# Patient Record
Sex: Female | Born: 1965 | Race: Black or African American | Hispanic: No | Marital: Married | Smoking: Never smoker
Health system: Southern US, Community
[De-identification: ages and names within clinical notes are randomized; demographics above are authoritative.]

## PROBLEM LIST (undated history)

## (undated) DIAGNOSIS — I10 Essential (primary) hypertension: Secondary | ICD-10-CM

## (undated) DIAGNOSIS — J189 Pneumonia, unspecified organism: Secondary | ICD-10-CM

## (undated) DIAGNOSIS — K449 Diaphragmatic hernia without obstruction or gangrene: Secondary | ICD-10-CM

## (undated) DIAGNOSIS — M1990 Unspecified osteoarthritis, unspecified site: Secondary | ICD-10-CM

## (undated) DIAGNOSIS — E7800 Pure hypercholesterolemia, unspecified: Secondary | ICD-10-CM

## (undated) DIAGNOSIS — D649 Anemia, unspecified: Secondary | ICD-10-CM

## (undated) HISTORY — DX: Diaphragmatic hernia without obstruction or gangrene: K44.9

---

## 2000-07-03 ENCOUNTER — Other Ambulatory Visit: Admission: RE | Admit: 2000-07-03 | Discharge: 2000-07-03 | Payer: Self-pay | Admitting: Plastic Surgery

## 2001-12-31 ENCOUNTER — Other Ambulatory Visit: Admission: RE | Admit: 2001-12-31 | Discharge: 2001-12-31 | Payer: Self-pay | Admitting: Obstetrics and Gynecology

## 2002-11-29 ENCOUNTER — Other Ambulatory Visit: Admission: RE | Admit: 2002-11-29 | Discharge: 2002-11-29 | Payer: Self-pay | Admitting: Obstetrics and Gynecology

## 2003-02-14 ENCOUNTER — Ambulatory Visit (HOSPITAL_COMMUNITY): Admission: AD | Admit: 2003-02-14 | Discharge: 2003-02-14 | Payer: Self-pay | Admitting: Obstetrics and Gynecology

## 2003-04-27 ENCOUNTER — Encounter: Payer: Self-pay | Admitting: Obstetrics and Gynecology

## 2003-04-27 ENCOUNTER — Inpatient Hospital Stay (HOSPITAL_COMMUNITY): Admission: AD | Admit: 2003-04-27 | Discharge: 2003-04-29 | Payer: Self-pay | Admitting: Obstetrics and Gynecology

## 2007-03-06 ENCOUNTER — Ambulatory Visit (HOSPITAL_COMMUNITY): Admission: RE | Admit: 2007-03-06 | Discharge: 2007-03-06 | Payer: Self-pay | Admitting: Obstetrics and Gynecology

## 2008-06-02 ENCOUNTER — Ambulatory Visit (HOSPITAL_COMMUNITY): Admission: RE | Admit: 2008-06-02 | Discharge: 2008-06-02 | Payer: Self-pay | Admitting: Obstetrics and Gynecology

## 2011-04-05 NOTE — H&P (Signed)
   NAME:  Patricia Pineda, Patricia Pineda                           ACCOUNT NO.:  [id number]   MEDICAL RECORD NO.:  [id number]                   PATIENT TYPE:  INP   LOCATION:  LDR3                                 FACILITY:  [facility]   PHYSICIAN:  Tilda Burrow, M.D.              DATE OF BIRTH:  [birth year]   DATE OF ADMISSION:  04/27/2003  DATE OF DISCHARGE:                                HISTORY & PHYSICAL   REASON FOR ADMISSION:  Pregnancy at 33 weeks and 2 days with preeclampsia.   HISTORY OF PRESENT ILLNESS:  Patient was seen in the office Monday with a  slight elevation in her blood pressure.  DTRs were 1+ and she was sent home  on bed rest to be reevaluated today.  She is back in today with headache,  some dizziness, and the blood pressure 160/100 and 3+ protein in her urine.   MEDICAL HISTORY:  1. Positive for asthma.  2. Chronic bronchitis.  3. Positive RPR.  4. Positive trichomonas.   SURGICAL HISTORY:  1. Positive for breast reduction.  2. Cesarean section.   ALLERGIES:  She has no known allergies.   SOCIAL HISTORY:  She is married. Her husband lives here. He is present and  supportive.   FAMILY HISTORY:  Positive for diabetes and hypertension.   PRENATAL COURSE:  Complicated by a positive RPR which was treated, a  positive trichomonas which was treated, an elevated 1 hour and a normal 2  hour.  Blood type is 0 positive.  GBS is negative.  Rubella is 2.6.  Hepatitis B surface antigen is negative.  HIV is negative.  Pap was within  normal limits.  GC and Chlamydia were both negative.  Sickle cell screen was  negative.   PHYSICAL EXAMINATION:  VITAL SIGNS:  Physical exam, today, weight is 266  pounds.  Blood pressure is 160/100.  She has trace to 1+ edema.  HEART:  Heart is regular to rhythm and rate.  LUNGS:  Lungs are clear to auscultation bilaterally.  EXTREMITIES:  DTRs are trace to 1+.  No clonus.  ABDOMEN:  Fetal heart is 130, strong and regular.   She has 3+ protein in her  urine.    PLAN:  We are going to draw labs, admit her, give betamethasone 12 IM and  repeat in 24 hours.  Start a 24-hour urine.  Dr. Despina Hidden is the collaborating  physician and he collaborated with admission and plan of care.     Zerita Boers, Reita Cliche, M.D.    DL/MEDQ  D:  [id number]  T:  04/27/2003  Job:  [id number]   cc:   [facility]

## 2011-04-05 NOTE — Discharge Summary (Signed)
NAME:  Patricia Pineda, Patricia Pineda                           ACCOUNT NO.:  [id number]   MEDICAL RECORD NO.:  [id number]                   PATIENT TYPE:  INP   LOCATION:  LDR3                                 FACILITY:  [facility]   PHYSICIAN:  Tilda Burrow, M.D.              DATE OF BIRTH:  Nov 20, 1965   DATE OF ADMISSION:  04/27/2003  DATE OF DISCHARGE:  04/29/2003                                 DISCHARGE SUMMARY   ADMITTING DIAGNOSES:  1. Pregnancy at 33-2/7 days.  2. Pre-eclampsia.   DISCHARGE DIAGNOSES:  1. Pregnancy at 33-4/7 days, severe.  2. Pre-eclampsia (by proteinuria and blood pressure control criteria).  3. Continuing prior cesarean section, cervical unchangeability.   HISTORY OF PRESENT ILLNESS:  This 45 year old female gravida 2, para 1, AB  0, last menstrual period September 05, 2002, placing [facility] at June 13, 2003 with  ultrasound on October 26, 2002 agreeing within two days of menstrual EDC and  20 week ultrasound suggesting EDC of June 06, 2003, is admitted at 33-2/7  days after presenting to the office with elevated blood pressures.  She was  seen in our office and found to have blood pressure elevation of 160/100 two  days after prior visit to our office.  In addition to blood pressure  increase over the last two days, she had 3+ proteinuria in her urine.  She  is admitted for suspected pre-eclampsia and assessment stabilization, beta  methasone therapy.   PAST MEDICAL HISTORY:  1. Asthma.  2. Chronic bronchitis.  3. History of positive RPR.   SURGICAL HISTORY:  Positive for reduction mammoplasty and cesarean section.   ALLERGIES:  No known drug allergies.   SOCIAL HISTORY:  Married.  Husband lives here, present, supportive.   FAMILY HISTORY:  Positive for diabetes and hypertension.  The patient  personally has no history of elevated blood pressures.   PRENATAL COURSE:  Complicated by positive RPR which was treated through the  [facility].   Trichomonas treated.  Elevated one and  two hour glucose tolerance test.  Blood type is O positive, group B strep  negative, Rubella immunity negative at 2.6 greater than 10 considered  immune).  Hepatitis B surface antigen negative.  HIV negative.  Pap smear  within normal limits.  GC and Chlamydia negative.  Sickle index negative.   PHYSICAL EXAMINATION:  VITAL SIGNS:  Weight 266 pounds which was a 22 pound  weight gain this pregnancy with no recent dramatic change.  Blood pressure  160/100.  GENERAL:  She is a healthy appearing female.  HEART/LUNG:  Regular rhythm. Lungs clear.  ABDOMEN:  She has a 36 cm fundal height.  Fetal heart rate strong, 130 and  regular.  CERVICAL:  As done by me on April 29, 2003 showed the cervix to be firm,  long, closed, and posterior.   HOSPITAL COURSE:  The patient was admitted and  laboratory work followed up  from the office which showed normal liver function test.  The patient was  admitted, had 24 hour urine culture which returned finally showing an urine  protein excretion of greater than 4 grams per day.  Serum creatinine was  0.9.  The patient denied any right upper quadrant pain and only had mild  headache.  She was discussed with Dr. Shelly Flatten after compelling  results, including an ultrasound which showed an estimated fetal weight of  2500 gram infant.   DISPOSITION:  She was therefore transferred to [facility] for  continued observation.  Non stress test the day of discharge was reactive.                                               Tilda Burrow, M.D.    JVF/MEDQ  D:  04/29/2003  T:  04/29/2003  Job:  [id number]

## 2012-01-16 ENCOUNTER — Encounter: Payer: Self-pay | Admitting: Family

## 2012-01-16 ENCOUNTER — Ambulatory Visit (INDEPENDENT_AMBULATORY_CARE_PROVIDER_SITE_OTHER): Payer: 59 | Admitting: Family

## 2012-01-16 VITALS — BP 124/80 | Ht 64.25 in | Wt 241.0 lb

## 2012-01-16 DIAGNOSIS — J069 Acute upper respiratory infection, unspecified: Secondary | ICD-10-CM

## 2012-01-16 DIAGNOSIS — I10 Essential (primary) hypertension: Secondary | ICD-10-CM

## 2012-01-16 DIAGNOSIS — E785 Hyperlipidemia, unspecified: Secondary | ICD-10-CM

## 2012-01-16 LAB — BASIC METABOLIC PANEL
BUN: 11 mg/dL (ref 6–23)
CO2: 25 mEq/L (ref 19–32)
GFR: 104.23 mL/min (ref >60.00)
Glucose, Bld: 74 mg/dL (ref 70–99)
Potassium: 3.8 mEq/L (ref 3.5–5.1)

## 2012-01-16 LAB — CBC
Hemoglobin: 10.4 g/dL — ABNORMAL LOW (ref 12.0–15.0)
MCHC: 32.2 g/dL (ref 30.0–36.0)
MCV: 66.1 fl — ABNORMAL LOW (ref 78.0–100.0)
RDW: 18.5 % — ABNORMAL HIGH (ref 11.5–14.6)

## 2012-01-16 LAB — LIPID PANEL
Total CHOL/HDL Ratio: 4
Triglycerides: 122 mg/dL (ref 0.0–149.0)

## 2012-01-16 MED ORDER — PREDNISONE 20 MG PO TABS: ORAL_TABLET | ORAL | Status: AC

## 2012-01-16 MED ORDER — DEXTROMETHORPHAN HBR 15 MG/5ML PO SYRP: 10.0000 mL | ORAL_SOLUTION | Freq: Four times a day (QID) | ORAL | Status: AC | PRN

## 2012-01-16 NOTE — Progress Notes (Signed)
  Subjective:    Patient ID: Patricia Pineda, female    DOB: 04/29/1966, 46 y.o.   MRN: [id number]  HPI Comments: C/o chills, productive cough with yellow-tinged sputum, constant-hacking cough, and intermittent headaches described as pressure with no associated s/s, 6/10. Took OTC mucinex without any relief.   Cough Associated symptoms include chills, headaches, postnasal drip and a sore throat. Pertinent negatives include no ear pain, eye redness, fever, rhinorrhea, shortness of breath or wheezing.      Review of Systems  Constitutional: Positive for chills and fatigue. Negative for fever and appetite change.  HENT: Positive for congestion, sore throat, postnasal drip and sinus pressure. Negative for hearing loss, ear pain, rhinorrhea, sneezing and ear discharge.   Eyes: Negative for discharge and redness.  Respiratory: Positive for cough. Negative for apnea, chest tightness, shortness of breath, wheezing and stridor.   Cardiovascular: Negative.   Neurological: Positive for headaches.   No past medical history on file.  History   Social History  . Marital Status: Married    Spouse Name: N/A    Number of Children: N/A  . Years of Education: N/A   Occupational History  . Not on file.   Social History Main Topics  . Smoking status: Never Smoker   . Smokeless tobacco: Not on file  . Alcohol Use: Yes     occassionally  . Drug Use: No  . Sexually Active: Not on file   Other Topics Concern  . Not on file   Social History Narrative  . No narrative on file    No past surgical history on file.  No family history on file.  No Known Allergies  No current outpatient prescriptions on file prior to visit.    BP 124/80  Ht 5' 4.25" (1.632 m)  Wt 241 lb (109.317 kg)  BMI 41.05 kg/m2chart     Objective:   Physical Exam  Constitutional: She appears well-developed and well-nourished. No distress.  HENT:  Right Ear: External ear normal.  Left Ear: External ear normal.    Nose: Nose normal.  Mouth/Throat: Oropharynx is clear and moist. No oropharyngeal exudate.  Cardiovascular: Normal rate, regular rhythm, normal heart sounds and intact distal pulses.  Exam reveals no gallop and no friction rub.   No murmur heard. Pulmonary/Chest: Effort normal and breath sounds normal. No respiratory distress. She has no wheezes. She has no rales. She exhibits no tenderness.  Neurological: She is alert.  Skin: Skin is warm. She is not diaphoretic.          Assessment & Plan:  Assessment: URI, Cough  Plan: Prednisone. Labs: CBC, TSH, Lipids, BMP. RTC for physical exam. Rest and increase po fluid intake. RTC if s/s do not resolve in one week or get worse. Teaching handouts provided on treatments and diagnosis.

## 2012-01-16 NOTE — Patient Instructions (Signed)

## 2012-03-18 ENCOUNTER — Encounter: Payer: Self-pay | Admitting: Family

## 2012-03-18 ENCOUNTER — Ambulatory Visit (INDEPENDENT_AMBULATORY_CARE_PROVIDER_SITE_OTHER): Payer: 59 | Admitting: Family

## 2012-03-18 ENCOUNTER — Telehealth: Payer: Self-pay | Admitting: Family

## 2012-03-18 VITALS — BP 122/78 | HR 88 | Temp 98.9°F | Resp 12 | Ht 66.0 in | Wt 243.0 lb

## 2012-03-18 DIAGNOSIS — Z0000 Encounter for general adult medical examination without abnormal findings: Secondary | ICD-10-CM

## 2012-03-18 DIAGNOSIS — I10 Essential (primary) hypertension: Secondary | ICD-10-CM

## 2012-03-18 DIAGNOSIS — Z23 Encounter for immunization: Secondary | ICD-10-CM

## 2012-03-18 DIAGNOSIS — E7800 Pure hypercholesterolemia, unspecified: Secondary | ICD-10-CM

## 2012-03-18 MED ORDER — AMLODIPINE BESYLATE 5 MG PO TABS: 5.0000 mg | ORAL_TABLET | Freq: Every day | ORAL | Status: DC

## 2012-03-18 NOTE — Patient Instructions (Signed)
Hypercholesterolemia High Blood Cholesterol Cholesterol is a white, waxy, fat-like protein needed by your body in small amounts. The liver makes all the cholesterol you need. It is carried from the liver by the blood through the blood vessels. Deposits (plaque) may build up on blood vessel walls. This makes the arteries narrower and stiffer. Plaque increases the risk for heart attack and stroke. You cannot feel your cholesterol level even if it is very high. The only way to know is by a blood test to check your lipid (fats) levels. Once you know your cholesterol levels, you should keep a record of the test results. Work with your caregiver to to keep your levels in the desired range. WHAT THE RESULTS MEAN:  Total cholesterol is a rough measure of all the cholesterol in your blood.   LDL is the so-called bad cholesterol. This is the type that deposits cholesterol in the walls of the arteries. You want this level to be low.   HDL is the good cholesterol because it cleans the arteries and carries the LDL away. You want this level to be high.   Triglycerides are fat that the body can either burn for energy or store. High levels are closely linked to heart disease.  DESIRED LEVELS:  Total cholesterol below 200.   LDL below 100 for people at risk, below 70 for very high risk.   HDL above 50 is good, above 60 is best.   Triglycerides below 150.  HOW TO LOWER YOUR CHOLESTEROL:  Diet.   Choose fish or white meat chicken and [place], roasted or baked. Limit fatty cuts of red meat, fried foods, and processed meats, such as sausage and lunch meat.   Eat lots of fresh fruits and vegetables. Choose whole grains, beans, pasta, potatoes and cereals.   Use only small amounts of olive, corn or canola oils. Avoid butter, mayonnaise, shortening or palm kernel oils. Avoid foods with trans-fats.   Use skim/nonfat milk and low-fat/nonfat yogurt and cheeses. Avoid whole milk, cream, ice cream, egg yolks and  cheeses. Healthy desserts include angel food cake, gingersnaps, animal crackers, hard candy, popsicles, and low-fat/nonfat frozen yogurt. Avoid pastries, cakes, pies and cookies.   Exercise.   A regular program helps decrease LDL and raises HDL.   Helps with weight control.   Do things that increase your activity level like gardening, walking, or taking the stairs.   Medication.   May be prescribed by your caregiver to help lowering cholesterol and the risk for heart disease.   You may need medicine even if your levels are normal if you have several risk factors.  HOME CARE INSTRUCTIONS   Follow your diet and exercise programs as suggested by your caregiver.   Take medications as directed.   Have blood work done when your caregiver feels it is necessary.  MAKE SURE YOU:   Understand these instructions.   Will watch your condition.   Will get help right away if you are not doing well or get worse.  Document Released: 11/04/2005 Document Revised: 10/24/2011 Document Reviewed: 04/22/2007 [facility] Patient Information 2012 [place], [place].  Exercise to Lose Weight Exercise and a healthy diet may help you lose weight. Your doctor may suggest specific exercises. EXERCISE IDEAS AND TIPS  Choose low-cost things you enjoy doing, such as walking, bicycling, or exercising to workout videos.   Take stairs instead of the elevator.   Walk during your lunch break.   Park your car further away from work or school.  Go to a gym or an exercise class.   Start with 5 to 10 minutes of exercise each day. Build up to 30 minutes of exercise 4 to 6 days a week.   Wear shoes with good support and comfortable clothes.   Stretch before and after working out.   Work out until you breathe harder and your heart beats faster.   Drink extra water when you exercise.   Do not do so much that you hurt yourself, feel dizzy, or get very short of breath.  Exercises that burn about 150  calories:  Running 1  miles in 15 minutes.   Playing volleyball for 45 to 60 minutes.   Washing and waxing a car for 45 to 60 minutes.   Playing touch football for 45 minutes.   Walking 1  miles in 35 minutes.   Pushing a stroller 1  miles in 30 minutes.   Playing basketball for 30 minutes.   Raking leaves for 30 minutes.   Bicycling 5 miles in 30 minutes.   Walking 2 miles in 30 minutes.   Dancing for 30 minutes.   Shoveling snow for 15 minutes.   Swimming laps for 20 minutes.   Walking up stairs for 15 minutes.   Bicycling 4 miles in 15 minutes.   Gardening for 30 to 45 minutes.   Jumping rope for 15 minutes.   Washing windows or floors for 45 to 60 minutes.  Document Released: 12/07/2010 Document Revised: 10/24/2011 Document Reviewed: 12/07/2010 [facility] Patient Information 2012 [place], [place].

## 2012-03-18 NOTE — Telephone Encounter (Signed)
Done

## 2012-03-18 NOTE — Progress Notes (Signed)
  Subjective:    Patient ID: Patricia Pineda, female    DOB: 10/26/1966, 46 y.o.   MRN: [id number]  HPI  This is a routine physical examination for this healthy  Female. Reviewed all health maintenance protocols including mammography reviewed appropriate screening labs. Her immunization history was reviewed as well as her current medications and allergies refills of her chronic medications were given and the plan for yearly health maintenance was discussed all orders and referrals were made as appropriate.   Review of Systems  Constitutional: Negative.   HENT: Negative.   Eyes: Negative.   Respiratory: Negative.   Cardiovascular: Negative.   Gastrointestinal: Negative.   Genitourinary: Negative.   Musculoskeletal: Negative.   Skin: Negative.   Neurological: Negative.   Hematological: Negative.   Psychiatric/Behavioral: Negative.    No past medical history on file.  History   Social History  . Marital Status: Married    Spouse Name: N/A    Number of Children: N/A  . Years of Education: N/A   Occupational History  . Not on file.   Social History Main Topics  . Smoking status: Never Smoker   . Smokeless tobacco: Not on file  . Alcohol Use: Yes     occassionally  . Drug Use: No  . Sexually Active: Not on file   Other Topics Concern  . Not on file   Social History Narrative  . No narrative on file    No past surgical history on file.  No family history on file.  No Known Allergies  Current Outpatient Prescriptions on File Prior to Visit  Medication Sig Dispense Refill  . amLODipine (NORVASC) 5 MG tablet Take 5 mg by mouth daily.      . rosuvastatin (CRESTOR) 10 MG tablet Take 10 mg by mouth daily.        BP 122/78  Pulse 88  Temp 98.9 F (37.2 C)  Resp 12  Ht 5\' 6"  (1.676 m)  Wt 243 lb (110.224 kg)  BMI 39.22 kg/m2  SpO2 96%  LMP 04/25/2013chart    Objective:   Physical Exam  Constitutional: She is oriented to person, place, and time. She appears  well-developed and well-nourished.  HENT:  Head: Normocephalic.  Right Ear: External ear normal.  Left Ear: External ear normal.  Nose: Nose normal.  Mouth/Throat: Oropharynx is clear and moist.  Eyes: Conjunctivae and EOM are normal. Pupils are equal, round, and reactive to light.  Neck: Normal range of motion. Neck supple.  Cardiovascular: Normal rate, regular rhythm and normal heart sounds.   Pulmonary/Chest: Effort normal and breath sounds normal.  Abdominal: Soft. Bowel sounds are normal.  Genitourinary:       Deferred to GYN  Musculoskeletal: Normal range of motion.  Neurological: She is alert and oriented to person, place, and time. She has normal reflexes.  Skin: Skin is warm and dry.  Psychiatric: She has a normal mood and affect.    EKG not obtained, patient reports a normal EKG previous practice less than one year ago.      Assessment & Plan:  Assessment: Complete physical exam, hypertension, obesity, hyperlipidemia  Plan: Since her labs were drawn off of Crestor, she appears to be doing fine. Therefore, we will redraw her last 3 months just to be sure that she stooled well well off Crestor. Encouraged healthy diet and exercise, weight reduction, monthly self breast exams. Tdap administered. Continue current medications minus Crestor.

## 2012-03-18 NOTE — Telephone Encounter (Signed)
Patient was seen today and she forgot to ask for a refill of her norvasc. Please assist.

## 2012-05-25 DIAGNOSIS — E7800 Pure hypercholesterolemia, unspecified: Secondary | ICD-10-CM | POA: Insufficient documentation

## 2012-05-25 DIAGNOSIS — I10 Essential (primary) hypertension: Secondary | ICD-10-CM | POA: Insufficient documentation

## 2012-06-17 ENCOUNTER — Other Ambulatory Visit: Payer: 59

## 2012-07-31 ENCOUNTER — Other Ambulatory Visit (INDEPENDENT_AMBULATORY_CARE_PROVIDER_SITE_OTHER): Payer: 59

## 2012-07-31 DIAGNOSIS — E785 Hyperlipidemia, unspecified: Secondary | ICD-10-CM

## 2012-07-31 LAB — LIPID PANEL
Cholesterol: 216 mg/dL — ABNORMAL HIGH (ref 0–200)
HDL: 44.8 mg/dL (ref >39.00)
Triglycerides: 114 mg/dL (ref 0.0–149.0)
VLDL: 22.8 mg/dL (ref 0.0–40.0)

## 2012-10-21 ENCOUNTER — Telehealth: Payer: Self-pay | Admitting: Family

## 2012-10-21 NOTE — Telephone Encounter (Signed)
Patient Information:  Caller Name: Danica  Phone: [phone]  Patient: Patricia Pineda, Patricia Pineda  Gender: Female  DOB: 07-08-1966  Age: 46 Years  PCP: [facility])  Pregnant: No   Symptoms  Reason For Call & Symptoms: Onset was sudden with sore throat, fever- low grade; dry cough and head ache.  Reviewed Health History In EMR: Yes  Reviewed Medications In EMR: Yes  Reviewed Allergies In EMR: Yes  Reviewed Surgeries / Procedures: Yes  Date of Onset of Symptoms: 10/18/2012  Treatments Tried: Mucinex at night  Treatments Tried Worked: Yes OB:  LMP: 10/14/2012  Guideline(s) Used:  Colds  Disposition Per Guideline:   See Today or Tomorrow in Office  Reason For Disposition Reached:   Patient wants to be seen  Advice Given:  For a Stuffy Nose - Use Nasal Washes:  Introduction: Saline (salt water) nasal irrigation (nasal wash) is an effective and simple home remedy for treating stuffy nose and sinus congestion. The nose can be irrigated by pouring, spraying, or squirting salt water into the nose and then letting it run back out.  How it Helps: The salt water rinses out excess mucus, washes out any irritants (dust, allergens) that might be present, and moistens the nasal cavity.  Methods: There are several ways to perform nasal irrigation. You can use a saline nasal spray bottle (available over-the-counter), a rubber ear syringe, a medical syringe without the needle, or a Neti Pot.  Treatment for Associated Symptoms of Colds:  For muscle aches, headaches, or moderate fever (more than 101 F or 38.9 C): Take acetaminophen every 4 hours.  Cough: Use cough drops.  Hydrate: Drink adequate liquids.  Humidifier:  If the air in your home is dry, use a cool-mist humidifier  Contagiousness:  The cold virus is present in your nasal secretions.  Cover your nose and mouth with a tissue when you sneeze or cough.  Wash your hands frequently with soap and water.  Expected Course:    Nasal discharge 7-14 days  Cough up to 2-3 weeks.  Call Back If:  Difficulty breathing occurs  Fever lasts more than 3 days  Nasal discharge lasts more than 10 days  Cough lasts more than 3 weeks  You become worse  Cough Medicines:  Home Remedy - Honey: This old home remedy has been shown to help decrease coughing at night. The adult dosage is 2 teaspoons (10 ml) at bedtime. Honey should not be given to infants under one year of age.  Pain and Fever Medicines:  For pain or fever relief, take either acetaminophen or ibuprofen.  Office Follow Up:  Does the office need to follow up with this patient?: No  Instructions For The Office: N/A  Appointment Scheduled:  10/22/2012 09:15:00 Appointment Scheduled Provider:  Eleonore Chiquito (Family Practice)  RN Note:  Today 10/21/12-is short of breath, tight chest, with body aches.  Stuffiness head with cloudy drainage. Dry cough for the most part, but does cough up yellow sputum cough in mornings. Cough interrupts activities and rest. Intake is fair, but drinking well. Having Sinus pressure.  Appointment scheduled for 10/22/12 at 9:15 with Dr. Frederica Kuster.

## 2012-10-22 ENCOUNTER — Ambulatory Visit: Payer: Self-pay | Admitting: Internal Medicine

## 2013-04-26 ENCOUNTER — Other Ambulatory Visit: Payer: Self-pay | Admitting: Family

## 2013-04-27 ENCOUNTER — Telehealth: Payer: Self-pay

## 2013-04-27 NOTE — Telephone Encounter (Signed)
Left detailed message to advise pt to schedule CPE. Refill will not be done until appointment is scheduled

## 2013-05-11 ENCOUNTER — Other Ambulatory Visit (INDEPENDENT_AMBULATORY_CARE_PROVIDER_SITE_OTHER): Payer: Managed Care, Other (non HMO)

## 2013-05-11 DIAGNOSIS — Z0000 Encounter for general adult medical examination without abnormal findings: Secondary | ICD-10-CM

## 2013-05-11 LAB — CBC WITH DIFFERENTIAL/PLATELET
Basophils Absolute: 0 10*3/uL (ref 0.0–0.1)
Eosinophils Absolute: 0.2 10*3/uL (ref 0.0–0.7)
Lymphocytes Relative: 39.4 % (ref 12.0–46.0)
MCHC: 32.5 g/dL (ref 30.0–36.0)
Monocytes Relative: 7 % (ref 3.0–12.0)
Neutrophils Relative %: 50.5 % (ref 43.0–77.0)
Platelets: 580 10*3/uL — ABNORMAL HIGH (ref 150.0–400.0)
RDW: 21.3 % — ABNORMAL HIGH (ref 11.5–14.6)

## 2013-05-11 LAB — BASIC METABOLIC PANEL
BUN: 9 mg/dL (ref 6–23)
CO2: 25 mEq/L (ref 19–32)
Calcium: 9.1 mg/dL (ref 8.4–10.5)
Creatinine, Ser: 0.7 mg/dL (ref 0.4–1.2)
GFR: 121.67 mL/min (ref >60.00)
Glucose, Bld: 98 mg/dL (ref 70–99)
Sodium: 140 mEq/L (ref 135–145)

## 2013-05-11 LAB — TSH: TSH: 1.2 u[IU]/mL (ref 0.35–5.50)

## 2013-05-11 LAB — POCT URINALYSIS DIPSTICK
Bilirubin, UA: NEGATIVE
Glucose, UA: NEGATIVE
Nitrite, UA: NEGATIVE

## 2013-05-11 LAB — LIPID PANEL
Cholesterol: 205 mg/dL — ABNORMAL HIGH (ref 0–200)
HDL: 43.6 mg/dL (ref >39.00)
Triglycerides: 118 mg/dL (ref 0.0–149.0)

## 2013-05-11 LAB — HEPATIC FUNCTION PANEL
AST: 16 U/L (ref 0–37)
Total Bilirubin: 0.4 mg/dL (ref 0.3–1.2)

## 2013-05-11 LAB — LDL CHOLESTEROL, DIRECT: Direct LDL: 147 mg/dL

## 2013-05-11 NOTE — Addendum Note (Signed)
Addended by: Bonnye Fava on: 05/11/2013 12:56 PM   Modules accepted: Orders

## 2013-05-17 ENCOUNTER — Encounter: Payer: 59 | Admitting: Family

## 2013-05-24 ENCOUNTER — Encounter: Payer: Self-pay | Admitting: Family

## 2013-05-24 ENCOUNTER — Ambulatory Visit (INDEPENDENT_AMBULATORY_CARE_PROVIDER_SITE_OTHER): Payer: Managed Care, Other (non HMO) | Admitting: Family

## 2013-05-24 VITALS — BP 112/68 | HR 68 | Ht 65.5 in | Wt 245.0 lb

## 2013-05-24 DIAGNOSIS — N390 Urinary tract infection, site not specified: Secondary | ICD-10-CM

## 2013-05-24 DIAGNOSIS — I10 Essential (primary) hypertension: Secondary | ICD-10-CM

## 2013-05-24 DIAGNOSIS — E7800 Pure hypercholesterolemia, unspecified: Secondary | ICD-10-CM

## 2013-05-24 DIAGNOSIS — Z0000 Encounter for general adult medical examination without abnormal findings: Secondary | ICD-10-CM

## 2013-05-24 LAB — POCT URINALYSIS DIPSTICK
Glucose, UA: NEGATIVE
Nitrite, UA: NEGATIVE

## 2013-05-24 MED ORDER — AMITRIPTYLINE HCL 10 MG PO TABS: 10.0000 mg | ORAL_TABLET | Freq: Every day | ORAL | Status: DC

## 2013-05-24 MED ORDER — PITAVASTATIN CALCIUM 2 MG PO TABS: 2.0000 mg | ORAL_TABLET | Freq: Every day | ORAL | Status: DC

## 2013-05-24 NOTE — Patient Instructions (Signed)

## 2013-05-25 NOTE — Progress Notes (Signed)
Subjective:    Patient ID: Patricia Pineda, female    DOB: Jan 20, 1966, 47 y.o.   MRN: [id number]  HPI This is a routine physical examination for this healthy  Female. Reviewed all health maintenance protocols including mammography colonoscopy bone density and reviewed appropriate screening labs. Her immunization history was reviewed as well as her current medications and allergies refills of her chronic medications were given and the plan for yearly health maintenance was discussed all orders and referrals were made as appropriate. She has a history of hyperlipidemia and is supposed to take Crestor but she hasn't taken it do to muscle aches and pains.  Patient reports having a headache to the right temporal area typically first thing in the morning, rating it a 5/10. Headache subsides as the day goes on. She denies any sensitivity to light or noise. She's under the care of gynecology for fibroids and cysts. She chronically runs a low hemoglobin related to heavy menstrual bleeding.  Review of Systems  Constitutional: Negative.   HENT: Negative.   Eyes: Negative.   Respiratory: Negative.   Cardiovascular: Negative.   Gastrointestinal: Negative.   Endocrine: Negative.   Genitourinary: Negative.   Musculoskeletal: Negative.   Skin: Negative.   Allergic/Immunologic: Negative.   Neurological: Negative.   Hematological: Negative.   Psychiatric/Behavioral: Negative.    No past medical history on file.  History   Social History  . Marital Status: Married    Spouse Name: N/A    Number of Children: N/A  . Years of Education: N/A   Occupational History  . Not on file.   Social History Main Topics  . Smoking status: Never Smoker   . Smokeless tobacco: Not on file  . Alcohol Use: Yes     Comment: occassionally  . Drug Use: No  . Sexually Active: Not on file   Other Topics Concern  . Not on file   Social History Narrative  . No narrative on file    No past surgical history on  file.  No family history on file.  No Known Allergies  Current Outpatient Prescriptions on File Prior to Visit  Medication Sig Dispense Refill  . amLODipine (NORVASC) 5 MG tablet TAKE 1 TABLET BY MOUTH DAILY  30 tablet  0   No current facility-administered medications on file prior to visit.    BP 112/68  Pulse 68  Ht 5' 5.5" (1.664 m)  Wt 245 lb (111.131 kg)  BMI 40.14 kg/m2  SpO2 98%chart    Objective:   Physical Exam  Constitutional: She is oriented to person, place, and time. She appears well-developed and well-nourished.  HENT:  Head: Normocephalic.  Right Ear: External ear normal.  Left Ear: External ear normal.  Nose: Nose normal.  Mouth/Throat: Oropharynx is clear and moist.  Eyes: Conjunctivae and EOM are normal. Pupils are equal, round, and reactive to light.  Neck: Normal range of motion. Neck supple. No thyromegaly present.  Cardiovascular: Normal rate, regular rhythm and normal heart sounds.   Pulmonary/Chest: Effort normal and breath sounds normal.  Abdominal: Soft. Bowel sounds are normal.  Musculoskeletal: Normal range of motion.  Neurological: She is alert and oriented to person, place, and time. She has normal reflexes.  Skin: Skin is warm and dry.  Psychiatric: She has a normal mood and affect.          Assessment & Plan:  Assessment: 1. Complete physical exam 2. Hypertension 3. Hyperlipidemia 4. Headaches-likely hormonal  Plan: Follow up with gynecology for  management of fibroids. We'll try amitriptyline 10 mg at bedtime to help with her headaches. Ultimately, I believe her headaches are related to a hormonal imbalance. We'll follow with patient in one month and sooner as needed. Start Livalo 2mg  once daily. D/C crestor due to muscle aches.

## 2013-06-03 ENCOUNTER — Other Ambulatory Visit: Payer: Self-pay | Admitting: Family

## 2013-06-07 ENCOUNTER — Telehealth: Payer: Self-pay | Admitting: Family

## 2013-06-07 MED ORDER — AMITRIPTYLINE HCL 10 MG PO TABS: 10.0000 mg | ORAL_TABLET | Freq: Every day | ORAL | Status: DC

## 2013-06-07 NOTE — Telephone Encounter (Signed)
Pharmacy called to request a 1 month refill of the pt's amLODipine (NORVASC) 5 MG tablet. Please assist.

## 2013-11-28 ENCOUNTER — Other Ambulatory Visit: Payer: Self-pay | Admitting: Family

## 2013-12-21 ENCOUNTER — Ambulatory Visit: Payer: Managed Care, Other (non HMO) | Admitting: Family

## 2013-12-30 ENCOUNTER — Ambulatory Visit: Payer: Managed Care, Other (non HMO) | Admitting: Family

## 2013-12-31 ENCOUNTER — Ambulatory Visit: Payer: Managed Care, Other (non HMO) | Admitting: Family

## 2014-01-13 ENCOUNTER — Other Ambulatory Visit: Payer: Self-pay | Admitting: Family

## 2014-03-07 ENCOUNTER — Other Ambulatory Visit: Payer: Self-pay | Admitting: Family

## 2014-04-20 ENCOUNTER — Other Ambulatory Visit: Payer: Self-pay | Admitting: Family

## 2014-05-18 ENCOUNTER — Other Ambulatory Visit: Payer: Self-pay | Admitting: Family

## 2014-05-24 ENCOUNTER — Ambulatory Visit: Payer: Managed Care, Other (non HMO) | Admitting: Family

## 2014-06-14 ENCOUNTER — Other Ambulatory Visit: Payer: Self-pay | Admitting: Family

## 2014-06-20 ENCOUNTER — Encounter: Payer: Self-pay | Admitting: Family

## 2014-06-20 ENCOUNTER — Ambulatory Visit (INDEPENDENT_AMBULATORY_CARE_PROVIDER_SITE_OTHER): Payer: Managed Care, Other (non HMO) | Admitting: Family

## 2014-06-20 VITALS — BP 118/80 | HR 78 | Ht 66.0 in | Wt 244.0 lb

## 2014-06-20 DIAGNOSIS — I10 Essential (primary) hypertension: Secondary | ICD-10-CM

## 2014-06-20 DIAGNOSIS — E7800 Pure hypercholesterolemia, unspecified: Secondary | ICD-10-CM

## 2014-06-20 DIAGNOSIS — Z0000 Encounter for general adult medical examination without abnormal findings: Secondary | ICD-10-CM

## 2014-06-20 LAB — COMPREHENSIVE METABOLIC PANEL
ALBUMIN: 4 g/dL (ref 3.5–5.2)
ALT: 14 U/L (ref 0–35)
AST: 22 U/L (ref 0–37)
Alkaline Phosphatase: 71 U/L (ref 39–117)
BUN: 14 mg/dL (ref 6–23)
CO2: 20 meq/L (ref 19–32)
Calcium: 9.6 mg/dL (ref 8.4–10.5)
Chloride: 102 mEq/L (ref 96–112)
Creatinine, Ser: 0.8 mg/dL (ref 0.4–1.2)
GFR: 98.68 mL/min (ref >60.00)
GLUCOSE: 98 mg/dL (ref 70–99)
POTASSIUM: 4.1 meq/L (ref 3.5–5.1)
SODIUM: 136 meq/L (ref 135–145)
TOTAL PROTEIN: 7.5 g/dL (ref 6.0–8.3)
Total Bilirubin: 0.3 mg/dL (ref 0.2–1.2)

## 2014-06-20 LAB — LIPID PANEL
Cholesterol: 209 mg/dL — ABNORMAL HIGH (ref 0–200)
HDL: 46.4 mg/dL (ref >39.00)
LDL Cholesterol: 147 mg/dL — ABNORMAL HIGH (ref 0–99)
NONHDL: 162.6
Total CHOL/HDL Ratio: 5
Triglycerides: 76 mg/dL (ref 0.0–149.0)
VLDL: 15.2 mg/dL (ref 0.0–40.0)

## 2014-06-20 LAB — POCT URINALYSIS DIPSTICK
Bilirubin, UA: NEGATIVE
Glucose, UA: NEGATIVE
Ketones, UA: NEGATIVE
LEUKOCYTES UA: NEGATIVE
NITRITE UA: NEGATIVE
PH UA: 5.5
Spec Grav, UA: 1.015
UROBILINOGEN UA: 0.2

## 2014-06-20 LAB — CBC WITH DIFFERENTIAL/PLATELET
Basophils Absolute: 0 10*3/uL (ref 0.0–0.1)
Basophils Relative: 0.4 % (ref 0.0–3.0)
EOS PCT: 1.7 % (ref 0.0–5.0)
Eosinophils Absolute: 0.2 10*3/uL (ref 0.0–0.7)
HEMATOCRIT: 36.1 % (ref 36.0–46.0)
Hemoglobin: 11.5 g/dL — ABNORMAL LOW (ref 12.0–15.0)
LYMPHS ABS: 2.1 10*3/uL (ref 0.7–4.0)
Lymphocytes Relative: 22.7 % (ref 12.0–46.0)
MCHC: 31.9 g/dL (ref 30.0–36.0)
MCV: 74.7 fl — AB (ref 78.0–100.0)
MONO ABS: 0.4 10*3/uL (ref 0.1–1.0)
Monocytes Relative: 4.5 % (ref 3.0–12.0)
Neutro Abs: 6.6 10*3/uL (ref 1.4–7.7)
Neutrophils Relative %: 70.7 % (ref 43.0–77.0)
Platelets: 508 10*3/uL — ABNORMAL HIGH (ref 150.0–400.0)
RBC: 4.84 Mil/uL (ref 3.87–5.11)
RDW: 15.9 % — ABNORMAL HIGH (ref 11.5–15.5)
WBC: 9.3 10*3/uL (ref 4.0–10.5)

## 2014-06-20 MED ORDER — AMLODIPINE BESYLATE 5 MG PO TABS: 5.0000 mg | ORAL_TABLET | Freq: Every day | ORAL | Status: DC

## 2014-06-20 NOTE — Progress Notes (Signed)
Subjective:    Patient ID: Patricia Pineda, female    DOB: [birth year], 48 y.o.   MRN: [id number]  HPI 48 year old [place] American female, nonsmoker is in today for complete physical exam. She has a history of hypertension. She is morbidly obese. Does not exercise. Reports increased stress at work and increased workloads. Sees gynecology for her female care.   Review of Systems  Constitutional: Negative.   HENT: Negative.   Eyes: Negative.   Respiratory: Negative.   Cardiovascular: Negative.   Gastrointestinal: Negative.   Endocrine: Negative.   Genitourinary: Negative.  Negative for vaginal discharge and vaginal pain.       Sees GYN  Musculoskeletal: Negative.   Skin: Negative.   Allergic/Immunologic: Negative.   Neurological: Negative.   Hematological: Negative.   Psychiatric/Behavioral: Negative.    No past medical history on file.  History   Social History  . Marital Status: Married    Spouse Name: N/A    Number of Children: N/A  . Years of Education: N/A   Occupational History  . Not on file.   Social History Main Topics  . Smoking status: Never Smoker   . Smokeless tobacco: Not on file  . Alcohol Use: Yes     Comment: occassionally  . Drug Use: No  . Sexual Activity: Not on file   Other Topics Concern  . Not on file   Social History Narrative  . No narrative on file    No past surgical history on file.  No family history on file.  No Known Allergies  No current outpatient prescriptions on file prior to visit.   No current facility-administered medications on file prior to visit.    BP 118/80  Pulse 78  Ht 5\' 6"  (1.676 m)  Wt 244 lb (110.678 kg)  BMI 39.40 kg/m2chart    Objective:   Physical Exam  Constitutional: She is oriented to person, place, and time. She appears well-developed and well-nourished.  HENT:  Head: Normocephalic and atraumatic.  Right Ear: External ear normal.  Left Ear: External ear normal.  Nose: Nose normal.    Mouth/Throat: Oropharynx is clear and moist.  Eyes: Conjunctivae are normal. Pupils are equal, round, and reactive to light.  Neck: Normal range of motion. Neck supple. No thyromegaly present.  Cardiovascular: Normal rate, regular rhythm and normal heart sounds.   Pulmonary/Chest: Effort normal and breath sounds normal.  Abdominal: Soft. Bowel sounds are normal.  Genitourinary:  deferred to GYN  Musculoskeletal: Normal range of motion.  Neurological: She is alert and oriented to person, place, and time. She has normal reflexes. She displays normal reflexes. No cranial nerve deficit. Coordination normal.  Skin: Skin is warm.  Psychiatric: She has a normal mood and affect.          Assessment & Plan:  Patricia Pineda was seen today for annual exam.  Diagnoses and associated orders for this visit:  Preventative health care - CMP - POC Urinalysis Dipstick - Lipid Panel - CBC with Differential - TSH  Unspecified essential hypertension - CMP - POC Urinalysis Dipstick - Lipid Panel - CBC with Differential - TSH  Pure hypercholesterolemia - CMP - POC Urinalysis Dipstick - Lipid Panel - CBC with Differential - TSH  Obesity, morbid - CMP - POC Urinalysis Dipstick - Lipid Panel - CBC with Differential - TSH  Other Orders - amLODipine (NORVASC) 5 MG tablet; Take 1 tablet (5 mg total) by mouth daily.   Encouraged healthy diet, exercise, weight reduction. Continue  amlodipine 5 mg once daily. Call the office with any questions or concerns. Recheck in 6 months and sooner as needed.

## 2014-06-20 NOTE — Progress Notes (Signed)
Pre visit review using our clinic review tool, if applicable. No additional management support is needed unless otherwise documented below in the visit note. 

## 2014-06-20 NOTE — Patient Instructions (Signed)
Exercise to Lose Weight Exercise and a healthy diet may help you lose weight. Your doctor may suggest specific exercises. EXERCISE IDEAS AND TIPS  Choose low-cost things you enjoy doing, such as walking, bicycling, or exercising to workout videos.  Take stairs instead of the elevator.  Walk during your lunch break.  Park your car further away from work or school.  Go to a gym or an exercise class.  Start with 5 to 10 minutes of exercise each day. Build up to 30 minutes of exercise 4 to 6 days a week.  Wear shoes with good support and comfortable clothes.  Stretch before and after working out.  Work out until you breathe harder and your heart beats faster.  Drink extra water when you exercise.  Do not do so much that you hurt yourself, feel dizzy, or get very short of breath. Exercises that burn about 150 calories:  Running 1  miles in 15 minutes.  Playing volleyball for 45 to 60 minutes.  Washing and waxing a car for 45 to 60 minutes.  Playing touch football for 45 minutes.  Walking 1  miles in 35 minutes.  Pushing a stroller 1  miles in 30 minutes.  Playing basketball for 30 minutes.  Raking leaves for 30 minutes.  Bicycling 5 miles in 30 minutes.  Walking 2 miles in 30 minutes.  Dancing for 30 minutes.  Shoveling snow for 15 minutes.  Swimming laps for 20 minutes.  Walking up stairs for 15 minutes.  Bicycling 4 miles in 15 minutes.  Gardening for 30 to 45 minutes.  Jumping rope for 15 minutes.  Washing windows or floors for 45 to 60 minutes. Document Released: 12/07/2010 Document Revised: 01/27/2012 Document Reviewed: 12/07/2010 ExitCare Patient Information 2015 [employer]. This information is not intended to replace advice given to you by your health care provider. Make sure you discuss any questions you have with your health care provider.  

## 2014-06-21 ENCOUNTER — Telehealth: Payer: Self-pay | Admitting: Family

## 2014-06-21 LAB — TSH: TSH: 1.33 u[IU]/mL (ref 0.35–4.50)

## 2014-06-21 NOTE — Telephone Encounter (Signed)
Relevant patient education mailed to patient.  

## 2014-06-24 ENCOUNTER — Telehealth: Payer: Self-pay

## 2014-06-24 DIAGNOSIS — E7800 Pure hypercholesterolemia, unspecified: Secondary | ICD-10-CM

## 2014-06-24 NOTE — Telephone Encounter (Signed)
Pt returned call to the office to clarify that she was on her cycle when urine sample was given for CPE.  She would also like to get medication for cholesterol. She does note that she has tried Crestor, Lipitor, and Livalo and none of those worked for her. She is also requesting that it be generic and not a large mg. Please advise

## 2014-06-27 MED ORDER — SIMVASTATIN 10 MG PO TABS: 10.0000 mg | ORAL_TABLET | ORAL | Status: DC

## 2014-06-27 NOTE — Telephone Encounter (Signed)
Left message to advise pt of note. Order placed for repeat lab

## 2014-06-27 NOTE — Telephone Encounter (Signed)
Take simvastatin 3 times per week. Recheck in 6 weeks.

## 2014-11-30 ENCOUNTER — Other Ambulatory Visit: Payer: Self-pay | Admitting: Family

## 2014-12-20 ENCOUNTER — Ambulatory Visit (INDEPENDENT_AMBULATORY_CARE_PROVIDER_SITE_OTHER): Payer: Managed Care, Other (non HMO) | Admitting: Family

## 2014-12-20 ENCOUNTER — Encounter: Payer: Self-pay | Admitting: Family

## 2014-12-20 VITALS — BP 140/90 | Temp 98.0°F | Wt 242.0 lb

## 2014-12-20 DIAGNOSIS — I10 Essential (primary) hypertension: Secondary | ICD-10-CM

## 2014-12-20 DIAGNOSIS — E7800 Pure hypercholesterolemia, unspecified: Secondary | ICD-10-CM

## 2014-12-20 DIAGNOSIS — E669 Obesity, unspecified: Secondary | ICD-10-CM

## 2014-12-20 LAB — COMPREHENSIVE METABOLIC PANEL
ALBUMIN: 4.1 g/dL (ref 3.5–5.2)
ALT: 13 U/L (ref 0–35)
AST: 12 U/L (ref 0–37)
Alkaline Phosphatase: 62 U/L (ref 39–117)
BILIRUBIN TOTAL: 0.3 mg/dL (ref 0.2–1.2)
BUN: 10 mg/dL (ref 6–23)
CHLORIDE: 107 meq/L (ref 96–112)
CO2: 25 mEq/L (ref 19–32)
Calcium: 9.1 mg/dL (ref 8.4–10.5)
Creatinine, Ser: 0.74 mg/dL (ref 0.40–1.20)
GFR: 107.74 mL/min (ref >60.00)
Glucose, Bld: 94 mg/dL (ref 70–99)
Potassium: 3.8 mEq/L (ref 3.5–5.1)
SODIUM: 138 meq/L (ref 135–145)
Total Protein: 7 g/dL (ref 6.0–8.3)

## 2014-12-20 LAB — LIPID PANEL
CHOL/HDL RATIO: 4
CHOLESTEROL: 185 mg/dL (ref 0–200)
HDL: 46.3 mg/dL (ref >39.00)
LDL Cholesterol: 117 mg/dL — ABNORMAL HIGH (ref 0–99)
NonHDL: 138.7
TRIGLYCERIDES: 109 mg/dL (ref 0.0–149.0)
VLDL: 21.8 mg/dL (ref 0.0–40.0)

## 2014-12-20 NOTE — Progress Notes (Signed)
Pre visit review using our clinic review tool, if applicable. No additional management support is needed unless otherwise documented below in the visit note. 

## 2014-12-20 NOTE — Progress Notes (Signed)
   Subjective:    Patient ID: Patricia Pineda, female    DOB: 06/30/1966, 49 y.o.   MRN: [id number]  HPI 49 year old African-American female, nonsmoker with a history of hypertension, hyperlipidemia, obesity is in today for recheck. Reports she's been walking on the treadmill. Tries to follow healthy diet. Takes simvastatin 3 times per week. Tolerates medication well.   Review of Systems  Constitutional: Negative.   HENT: Negative.   Respiratory: Negative.   Cardiovascular: Negative.   Endocrine: Negative.   Musculoskeletal: Negative.  Negative for arthralgias.  All other systems reviewed and are negative.  History reviewed. No pertinent past medical history.  History   Social History  . Marital Status: Married    Spouse Name: N/A    Number of Children: N/A  . Years of Education: N/A   Occupational History  . Not on file.   Social History Main Topics  . Smoking status: Never Smoker   . Smokeless tobacco: Not on file  . Alcohol Use: Yes     Comment: occassionally  . Drug Use: No  . Sexual Activity: Not on file   Other Topics Concern  . Not on file   Social History Narrative    History reviewed. No pertinent past surgical history.  History reviewed. No pertinent family history.  No Known Allergies  Current Outpatient Prescriptions on File Prior to Visit  Medication Sig Dispense Refill  . amLODipine (NORVASC) 5 MG tablet TAKE 1 TABLET BY MOUTH DAILY 90 tablet 0  . simvastatin (ZOCOR) 10 MG tablet TAKE 1 TABLET (10 MG TOTAL) BY MOUTH 3 (THREE) TIMES A WEEK. 15 tablet 2   No current facility-administered medications on file prior to visit.    BP 140/90 mmHg  Temp(Src) 98 F (36.7 C) (Oral)  Wt 242 lb (109.77 kg)chart     Objective:   Physical Exam  Constitutional: She is oriented to person, place, and time. She appears well-developed and well-nourished.  HENT:  Mouth/Throat: Oropharynx is clear and moist.  Eyes: Pupils are equal, round, and reactive to  light.  Neck: Normal range of motion. Neck supple. No thyromegaly present.  Cardiovascular: Normal rate, regular rhythm and normal heart sounds.   Pulmonary/Chest: Effort normal and breath sounds normal.  Abdominal: Soft. Bowel sounds are normal.  Musculoskeletal: Normal range of motion.  Neurological: She is alert and oriented to person, place, and time.  Skin: Skin is warm and dry.  Psychiatric: She has a normal mood and affect.          Assessment & Plan:  Harlan was seen today for follow-up.  Diagnoses and associated orders for this visit:  Essential hypertension - Lipid Panel - CMP  Pure hypercholesterolemia - Lipid Panel - CMP  Obesity    Encouraged healthy diet and exercise. Weight reduction. Call the office with any questions or concerns. Follow-up for complete please physical exam in 6 months and sooner as needed.

## 2014-12-20 NOTE — Patient Instructions (Signed)
Fat and Cholesterol Control Diet Fat and cholesterol levels in your blood and organs are influenced by your diet. High levels of fat and cholesterol may lead to diseases of the heart, small and large blood vessels, gallbladder, liver, and pancreas. CONTROLLING FAT AND CHOLESTEROL WITH DIET Although exercise and lifestyle factors are important, your diet is key. That is because certain foods are known to raise cholesterol and others to lower it. The goal is to balance foods for their effect on cholesterol and more importantly, to replace saturated and trans fat with other types of fat, such as monounsaturated fat, polyunsaturated fat, and omega-3 fatty acids. On average, a person should consume no more than 15 to 17 g of saturated fat daily. Saturated and trans fats are considered "bad" fats, and they will raise LDL cholesterol. Saturated fats are primarily found in animal products such as meats, butter, and cream. However, that does not mean you need to give up all your favorite foods. Today, there are good tasting, low-fat, low-cholesterol substitutes for most of the things you like to eat. Choose low-fat or nonfat alternatives. Choose round or loin cuts of red meat. These types of cuts are lowest in fat and cholesterol. Chicken (without the skin), fish, veal, and ground turkey breast are great choices. Eliminate fatty meats, such as hot dogs and salami. Even shellfish have little or no saturated fat. Have a 3 oz (85 g) portion when you eat lean meat, poultry, or fish. Trans fats are also called "partially hydrogenated oils." They are oils that have been scientifically manipulated so that they are solid at room temperature resulting in a longer shelf life and improved taste and texture of foods in which they are added. Trans fats are found in stick margarine, some tub margarines, cookies, crackers, and baked goods.  When baking and cooking, oils are a great substitute for butter. The monounsaturated oils are  especially beneficial since it is believed they lower LDL and raise HDL. The oils you should avoid entirely are saturated tropical oils, such as coconut and palm.  Remember to eat a lot from food groups that are naturally free of saturated and trans fat, including fish, fruit, vegetables, beans, grains (barley, rice, couscous, bulgur wheat), and pasta (without cream sauces).  IDENTIFYING FOODS THAT LOWER FAT AND CHOLESTEROL  Soluble fiber may lower your cholesterol. This type of fiber is found in fruits such as apples, vegetables such as broccoli, potatoes, and carrots, legumes such as beans, peas, and lentils, and grains such as barley. Foods fortified with plant sterols (phytosterol) may also lower cholesterol. You should eat at least 2 g per day of these foods for a cholesterol lowering effect.  Read package labels to identify low-saturated fats, trans fat free, and low-fat foods at the supermarket. Select cheeses that have only 2 to 3 g saturated fat per ounce. Use a heart-healthy tub margarine that is free of trans fats or partially hydrogenated oil. When buying baked goods (cookies, crackers), avoid partially hydrogenated oils. Breads and muffins should be made from whole grains (whole-wheat or whole oat flour, instead of "flour" or "enriched flour"). Buy non-creamy canned soups with reduced salt and no added fats.  FOOD PREPARATION TECHNIQUES  Never deep-fry. If you must fry, either stir-fry, which uses very little fat, or use non-stick cooking sprays. When possible, broil, bake, or roast meats, and steam vegetables. Instead of putting butter or margarine on vegetables, use lemon and herbs, applesauce, and cinnamon (for squash and sweet potatoes). Use nonfat   yogurt, salsa, and low-fat dressings for salads.  LOW-SATURATED FAT / LOW-FAT FOOD SUBSTITUTES Meats / Saturated Fat (g)  Avoid: Steak, marbled (3 oz/85 g) / 11 g  Choose: Steak, lean (3 oz/85 g) / 4 g  Avoid: Hamburger (3 oz/85 g) / 7  g  Choose: Hamburger, lean (3 oz/85 g) / 5 g  Avoid: Ham (3 oz/85 g) / 6 g  Choose: Ham, lean cut (3 oz/85 g) / 2.4 g  Avoid: Chicken, with skin, dark meat (3 oz/85 g) / 4 g  Choose: Chicken, skin removed, dark meat (3 oz/85 g) / 2 g  Avoid: Chicken, with skin, light meat (3 oz/85 g) / 2.5 g  Choose: Chicken, skin removed, light meat (3 oz/85 g) / 1 g Dairy / Saturated Fat (g)  Avoid: Whole milk (1 cup) / 5 g  Choose: Low-fat milk, 2% (1 cup) / 3 g  Choose: Low-fat milk, 1% (1 cup) / 1.5 g  Choose: Skim milk (1 cup) / 0.3 g  Avoid: Hard cheese (1 oz/28 g) / 6 g  Choose: Skim milk cheese (1 oz/28 g) / 2 to 3 g  Avoid: Cottage cheese, 4% fat (1 cup) / 6.5 g  Choose: Low-fat cottage cheese, 1% fat (1 cup) / 1.5 g  Avoid: Ice cream (1 cup) / 9 g  Choose: Sherbet (1 cup) / 2.5 g  Choose: Nonfat frozen yogurt (1 cup) / 0.3 g  Choose: Frozen fruit bar / trace  Avoid: Whipped cream (1 tbs) / 3.5 g  Choose: Nondairy whipped topping (1 tbs) / 1 g Condiments / Saturated Fat (g)  Avoid: Mayonnaise (1 tbs) / 2 g  Choose: Low-fat mayonnaise (1 tbs) / 1 g  Avoid: Butter (1 tbs) / 7 g  Choose: Extra light margarine (1 tbs) / 1 g  Avoid: Coconut oil (1 tbs) / 11.8 g  Choose: Olive oil (1 tbs) / 1.8 g  Choose: Corn oil (1 tbs) / 1.7 g  Choose: Safflower oil (1 tbs) / 1.2 g  Choose: Sunflower oil (1 tbs) / 1.4 g  Choose: Soybean oil (1 tbs) / 2.4 g  Choose: Canola oil (1 tbs) / 1 g Document Released: 11/04/2005 Document Revised: 03/01/2013 Document Reviewed: 02/02/2014 ExitCare Patient Information 2015 [employer]. This information is not intended to replace advice given to you by your health care provider. Make sure you discuss any questions you have with your health care provider.  

## 2015-02-01 ENCOUNTER — Other Ambulatory Visit: Payer: Self-pay | Admitting: Family

## 2015-02-14 ENCOUNTER — Telehealth: Payer: Self-pay | Admitting: Family

## 2015-02-14 NOTE — Telephone Encounter (Signed)
Pt called and ask if you Sheran Lawless would give her a call. She did not tell me what she wanted

## 2015-02-14 NOTE — Telephone Encounter (Signed)
Returned call to pt. She wanted to be sure Padonda's schedule is as discussed when she was last in the office. Pt aware that Padonda's schedule does vary and is opened on a monthly basis. Pt would like to schedule an appointment because she is experiencing some "highs and lows"  Pt will call back Monday to schedule

## 2015-07-16 ENCOUNTER — Other Ambulatory Visit: Payer: Self-pay | Admitting: Family

## 2015-11-24 ENCOUNTER — Other Ambulatory Visit: Payer: Self-pay | Admitting: Family

## 2015-12-12 ENCOUNTER — Telehealth: Payer: Self-pay | Admitting: Family

## 2015-12-12 NOTE — Telephone Encounter (Signed)
Noted  

## 2015-12-12 NOTE — Telephone Encounter (Signed)
Patient Name: Patricia Pineda  DOB: 13-Jul-1966    Initial Comment Caller states c/o left side pain from neck down to arm   Nurse Assessment  Nurse: Leilani Merl, RN, Heather Date/Time (Eastern Time): 12/12/2015 9:23:58 AM  Confirm and document reason for call. If symptomatic, describe symptoms. You must click the next button to save text entered. ---Caller states c/o left side pain from neck down to arm that happened on saturday that lasted for about 20 min, on Sunday she had an episode of dizziness that passed, Today she is not having any pain or dizziness at this time.  Has the patient traveled out of the country within the last 30 days? ---Not Applicable  Does the patient have any new or worsening symptoms? ---Yes  Will a triage be completed? ---Yes  Related visit to physician within the last 2 weeks? ---No  Does the PT have any chronic conditions? (i.e. diabetes, asthma, etc.) ---Yes  List chronic conditions. ---HTN, high cholesterol  Did the patient indicate they were pregnant? ---No  Is this a behavioral health or substance abuse call? ---No     Guidelines    Guideline Title Affirmed Question Affirmed Notes  Arm Pain Arm pain (all triage questions negative)    Final Disposition User   See Physician within 24 Hours Standifer, RN, Water quality scientist    Comments  Appt made with Dr. Raliegh Ip. tomorrow at 9:15 am.   Referrals  REFERRED TO PCP OFFICE   Disagree/Comply: Comply

## 2015-12-13 ENCOUNTER — Other Ambulatory Visit: Payer: Self-pay | Admitting: Family

## 2015-12-13 ENCOUNTER — Encounter: Payer: Self-pay | Admitting: Internal Medicine

## 2015-12-13 ENCOUNTER — Ambulatory Visit (INDEPENDENT_AMBULATORY_CARE_PROVIDER_SITE_OTHER): Payer: Managed Care, Other (non HMO) | Admitting: Internal Medicine

## 2015-12-13 VITALS — BP 130/90 | HR 74 | Temp 99.1°F | Resp 20 | Ht 66.0 in | Wt 245.0 lb

## 2015-12-13 DIAGNOSIS — I10 Essential (primary) hypertension: Secondary | ICD-10-CM

## 2015-12-13 DIAGNOSIS — S161XXA Strain of muscle, fascia and tendon at neck level, initial encounter: Secondary | ICD-10-CM | POA: Diagnosis not present

## 2015-12-13 NOTE — Patient Instructions (Signed)
Take Aleve 200 mg twice daily for pain or swelling  Cervical Sprain A cervical sprain is when the tissues (ligaments) that hold the neck bones in place stretch or tear. HOME CARE   Put ice on the injured area.  Put ice in a plastic bag.  Place a towel between your skin and the bag.  Leave the ice on for 15-20 minutes, 3-4 times a day.  You may have been given a collar to wear. This collar keeps your neck from moving while you heal.  Do not take the collar off unless told by your doctor.  If you have long hair, keep it outside of the collar.  Ask your doctor before changing the position of your collar. You may need to change its position over time to make it more comfortable.  If you are allowed to take off the collar for cleaning or bathing, follow your doctor's instructions on how to do it safely.  Keep your collar clean by wiping it with mild soap and water. Dry it completely. If the collar has removable pads, remove them every 1-2 days to hand wash them with soap and water. Allow them to air dry. They should be dry before you wear them in the collar.  Do not drive while wearing the collar.  Only take medicine as told by your doctor.  Keep all doctor visits as told.  Keep all physical therapy visits as told.  Adjust your work station so that you have good posture while you work.  Avoid positions and activities that make your problems worse.  Warm up and stretch before being active. GET HELP IF:  Your pain is not controlled with medicine.  You cannot take less pain medicine over time as planned.  Your activity level does not improve as expected. GET HELP RIGHT AWAY IF:   You are bleeding.  Your stomach is upset.  You have an allergic reaction to your medicine.  You develop new problems that you cannot explain.  You lose feeling (become numb) or you cannot move any part of your body (paralysis).  You have tingling or weakness in any part of your  body.  Your symptoms get worse. Symptoms include:  Pain, soreness, stiffness, puffiness (swelling), or a burning feeling in your neck.  Pain when your neck is touched.  Shoulder or upper back pain.  Limited ability to move your neck.  Headache.  Dizziness.  Your hands or arms feel week, lose feeling, or tingle.  Muscle spasms.  Difficulty swallowing or chewing. MAKE SURE YOU:   Understand these instructions.  Will watch your condition.  Will get help right away if you are not doing well or get worse.   This information is not intended to replace advice given to you by your health care provider. Make sure you discuss any questions you have with your health care provider.   Document Released: 04/22/2008 Document Revised: 07/07/2013 Document Reviewed: 05/12/2013 Elsevier Interactive Patient Education [employer].

## 2015-12-13 NOTE — Progress Notes (Signed)
Pre visit review using our clinic review tool, if applicable. No additional management support is needed unless otherwise documented below in the visit note. 

## 2015-12-13 NOTE — Progress Notes (Signed)
   Subjective:    Patient ID: Patricia Pineda, female    DOB: November 21, 1965, 50 y.o.   MRN: [id number]  HPI  50 year old patient who has a history of essential hypertension and dyslipidemia. She presents with a four-day history of intermittent left lateral neck discomfort with radiation to the proximal left arm.  Pain seems be paroxysmal and not related to movement of the head, neck or other activities.  She has had intermittent discomfort.  Milder over the past 2 months. She does have family history of coronary artery disease and her chief concern was a symptom of heart disease  No past medical history on file.  Social History   Social History  . Marital Status: Married    Spouse Name: N/A  . Number of Children: N/A  . Years of Education: N/A   Occupational History  . Not on file.   Social History Main Topics  . Smoking status: Never Smoker   . Smokeless tobacco: Not on file  . Alcohol Use: Yes     Comment: occassionally  . Drug Use: No  . Sexual Activity: Not on file   Other Topics Concern  . Not on file   Social History Narrative    No past surgical history on file.  No family history on file.  No Known Allergies  Current Outpatient Prescriptions on File Prior to Visit  Medication Sig Dispense Refill  . amLODipine (NORVASC) 5 MG tablet TAKE 1 TABLET BY MOUTH DAILY 30 tablet 0  . simvastatin (ZOCOR) 10 MG tablet TAKE 1 TABLET (10 MG TOTAL) BY MOUTH 3 (THREE) TIMES A WEEK. 15 tablet 5   No current facility-administered medications on file prior to visit.    BP 130/90 mmHg  Pulse 74  Temp(Src) 99.1 F (37.3 C) (Oral)  Resp 20  Ht 5\' 6"  (1.676 m)  Wt 245 lb (111.131 kg)  BMI 39.56 kg/m2  SpO2 98%     Review of Systems  Constitutional: Negative.   HENT: Negative for congestion, dental problem, hearing loss, rhinorrhea, sinus pressure, sore throat and tinnitus.   Eyes: Negative for pain, discharge and visual disturbance.  Respiratory: Negative for cough and  shortness of breath.   Cardiovascular: Negative for chest pain, palpitations and leg swelling.  Gastrointestinal: Negative for nausea, vomiting, abdominal pain, diarrhea, constipation, blood in stool and abdominal distention.  Genitourinary: Negative for dysuria, urgency, frequency, hematuria, flank pain, vaginal bleeding, vaginal discharge, difficulty urinating, vaginal pain and pelvic pain.  Musculoskeletal: Positive for neck pain and neck stiffness. Negative for joint swelling, arthralgias and gait problem.  Skin: Negative for rash.  Neurological: Negative for dizziness, syncope, speech difficulty, weakness, numbness and headaches.  Hematological: Negative for adenopathy.  Psychiatric/Behavioral: Negative for behavioral problems, dysphoric mood and agitation. The patient is not nervous/anxious.        Objective:   Physical Exam  Constitutional:  Repeat blood pressure 130/82  Neck:  Full range of motion of the head and neck Range of motion.  Did not aggravate neck or left arm discomfort   Neurological:  Normal grip strength Arm flexion and extension normal Biceps and triceps reflexes symmetrical          Assessment & Plan:   Cervical strain.  Measures discussed.  She report any clinical worsening.  She is aware of the remote possibility of a cervical radiculopathy Hypertension Dyslipidemia  Continue aggressive risk factor modification

## 2016-01-08 ENCOUNTER — Ambulatory Visit (INDEPENDENT_AMBULATORY_CARE_PROVIDER_SITE_OTHER)
Admission: RE | Admit: 2016-01-08 | Discharge: 2016-01-08 | Disposition: A | Discharge status: Home / Self Care | Payer: Managed Care, Other (non HMO) | Source: Ambulatory Visit | Attending: Family Medicine | Admitting: Family Medicine

## 2016-01-08 ENCOUNTER — Encounter: Payer: Self-pay | Admitting: Family Medicine

## 2016-01-08 ENCOUNTER — Telehealth: Payer: Self-pay | Admitting: Family Medicine

## 2016-01-08 ENCOUNTER — Ambulatory Visit (INDEPENDENT_AMBULATORY_CARE_PROVIDER_SITE_OTHER): Payer: Managed Care, Other (non HMO) | Admitting: Family Medicine

## 2016-01-08 VITALS — BP 130/90 | HR 99 | Temp 100.0°F | Wt 241.5 lb

## 2016-01-08 DIAGNOSIS — R05 Cough: Secondary | ICD-10-CM

## 2016-01-08 DIAGNOSIS — R059 Cough, unspecified: Secondary | ICD-10-CM

## 2016-01-08 DIAGNOSIS — J209 Acute bronchitis, unspecified: Secondary | ICD-10-CM | POA: Diagnosis not present

## 2016-01-08 DIAGNOSIS — J069 Acute upper respiratory infection, unspecified: Secondary | ICD-10-CM | POA: Diagnosis not present

## 2016-01-08 LAB — POCT INFLUENZA A/B
INFLUENZA A, POC: NEGATIVE
Influenza B, POC: NEGATIVE

## 2016-01-08 MED ORDER — AZITHROMYCIN 250 MG PO TABS: ORAL_TABLET | ORAL | Status: DC

## 2016-01-08 MED ORDER — PREDNISONE 10 MG PO TABS: ORAL_TABLET | ORAL | Status: DC

## 2016-01-08 MED ORDER — BENZONATATE 100 MG PO CAPS: 100.0000 mg | ORAL_CAPSULE | Freq: Three times a day (TID) | ORAL | Status: DC

## 2016-01-08 NOTE — Telephone Encounter (Addendum)
Chest X-ray to rule out pneumonia versus bronchitis noted mild bilateral pulmonary interstitial prominence and atypical pneumonitis cannot be excluded. Borderline cardiomegaly noted.

## 2016-01-08 NOTE — Patient Instructions (Addendum)
Please take prednisone as directed for cough. Please go to [place] [facility] for chest X-ray and results will be called to you. You may also use Mucinex-DM for cough and benzonatate for cough not helped with Mucinex-DM. Increase fluids, rest, and please follow up for further evaluation if symptoms do not improve in 3-4 days, worsen, or you develop a fever >101.   Tylenol can be used for discomfort.  Acute Bronchitis Bronchitis is inflammation of the airways that extend from the windpipe into the lungs (bronchi). The inflammation often causes mucus to develop. This leads to a cough, which is the most common symptom of bronchitis.  In acute bronchitis, the condition usually develops suddenly and goes away over time, usually in a couple weeks. Smoking, allergies, and asthma can make bronchitis worse. Repeated episodes of bronchitis may cause further lung problems.  CAUSES Acute bronchitis is most often caused by the same virus that causes a cold. The virus can spread from person to person (contagious) through coughing, sneezing, and touching contaminated objects. SIGNS AND SYMPTOMS   Cough.   Fever.   Coughing up mucus.   Body aches.   Chest congestion.   Chills.   Shortness of breath.   Sore throat.  DIAGNOSIS  Acute bronchitis is usually diagnosed through a physical exam. Your health care provider will also ask you questions about your medical history. Tests, such as chest X-rays, are sometimes done to rule out other conditions.  TREATMENT  Acute bronchitis usually goes away in a couple weeks. Oftentimes, no medical treatment is necessary. Medicines are sometimes given for relief of fever or cough. Antibiotic medicines are usually not needed but may be prescribed in certain situations. In some cases, an inhaler may be recommended to help reduce shortness of breath and control the cough. A cool mist vaporizer may also be used to help thin bronchial secretions and make it easier to  clear the chest.  HOME CARE INSTRUCTIONS  Get plenty of rest.   Drink enough fluids to keep your urine clear or pale yellow (unless you have a medical condition that requires fluid restriction). Increasing fluids may help thin your respiratory secretions (sputum) and reduce chest congestion, and it will prevent dehydration.   Take medicines only as directed by your health care provider.  If you were prescribed an antibiotic medicine, finish it all even if you start to feel better.  Avoid smoking and secondhand smoke. Exposure to cigarette smoke or irritating chemicals will make bronchitis worse. If you are a smoker, consider using nicotine gum or skin patches to help control withdrawal symptoms. Quitting smoking will help your lungs heal faster.   Reduce the chances of another bout of acute bronchitis by washing your hands frequently, avoiding people with cold symptoms, and trying not to touch your hands to your mouth, nose, or eyes.   Keep all follow-up visits as directed by your health care provider.  SEEK MEDICAL CARE IF: Your symptoms do not improve after 1 week of treatment.  SEEK IMMEDIATE MEDICAL CARE IF:  You develop an increased fever or chills.   You have chest pain.   You have severe shortness of breath.  You have bloody sputum.   You develop dehydration.  You faint or repeatedly feel like you are going to pass out.  You develop repeated vomiting.  You develop a severe headache. MAKE SURE YOU:   Understand these instructions.  Will watch your condition.  Will get help right away if you are not  doing well or get worse.   This information is not intended to replace advice given to you by your health care provider. Make sure you discuss any questions you have with your health care provider.   Document Released: 12/12/2004 Document Revised: 11/25/2014 Document Reviewed: 04/27/2013 Elsevier Interactive Patient Education [employer].

## 2016-01-08 NOTE — Addendum Note (Signed)
Addended by: Delano Metz A on: 01/08/2016 05:23 PM   Modules accepted: Miquel Dunn

## 2016-01-08 NOTE — Progress Notes (Signed)
Subjective:    Patient ID: Patricia Pineda, female    DOB: 07/25/1966, 50 y.o.   MRN: [id number]  HPI  Patricia Pineda is a 50 year old female who presents today with cough productive of clear to yellow sputum which wakes her at night. She appears ill and has a fever of 100 F. Associated symptoms of rhinitis, ear pressure, and chills are noted. Symptoms started 3 days ago and patient notes that she also has a decreased appetite and wheezing at times. She denies a history of asthma and bronchitis but she has a history of pneumonia and  has noted recent sick contacts at work.  Treatments at home include Mucinex and OTC cold and flu medication which have provided limited benefit.  Retake of heart rate:  99   Chest X-ray impression noted:  FINDINGS: Mediastinum and hilar structures are normal. Borderline cardiomegaly. Mild bilateral pulmonary interstitial prominence noted. Atypical pneumonitis cannot be excluded. No pleural effusion pneumothorax. Degenerative changes thoracic spine.  IMPRESSION: 1. Mild bilateral from interstitial prominence. Atypical pneumonitis cannot be excluded.  2. Borderline cardiomegaly   Review of Systems  Constitutional: Positive for fever, chills and fatigue.  HENT: Positive for postnasal drip and rhinorrhea.        Ear pressure  Respiratory: Positive for cough and wheezing. Negative for chest tightness.   Cardiovascular: Negative for chest pain and palpitations.  Gastrointestinal: Negative for nausea, vomiting and diarrhea.  Genitourinary: Negative for dysuria.  Musculoskeletal: Negative for myalgias.  Skin: Negative for pallor and rash.  Neurological: Negative for dizziness, light-headedness and headaches.   No past medical history on file.  Social History   Social History  . Marital Status: Married    Spouse Name: N/A  . Number of Children: N/A  . Years of Education: N/A   Occupational History  . Not on file.   Social History Main Topics  . Smoking  status: Never Smoker   . Smokeless tobacco: Not on file  . Alcohol Use: Yes     Comment: occassionally  . Drug Use: No  . Sexual Activity: Not on file   Other Topics Concern  . Not on file   Social History Narrative    No past surgical history on file.  No family history on file.  No Known Allergies  Current Outpatient Prescriptions on File Prior to Visit  Medication Sig Dispense Refill  . amLODipine (NORVASC) 5 MG tablet TAKE 1 TABLET BY MOUTH DAILY 30 tablet 0  . amLODipine (NORVASC) 5 MG tablet TAKE 1 TABLET BY MOUTH DAILY 90 tablet 0  . simvastatin (ZOCOR) 10 MG tablet TAKE 1 TABLET (10 MG TOTAL) BY MOUTH 3 (THREE) TIMES A WEEK. 15 tablet 5   No current facility-administered medications on file prior to visit.    BP 130/90 mmHg  Pulse 99  Temp(Src) 100 F (37.8 C) (Oral)  Wt 241 lb 8 oz (109.544 kg)  SpO2 97%      Objective:   Physical Exam  Constitutional: She is oriented to person, place, and time. She appears well-developed and well-nourished.  Cardiovascular: Normal rate and regular rhythm.  Exam reveals no gallop and no friction rub.   No murmur heard. Pulmonary/Chest: Effort normal. She has wheezes.  Mild basilar rales noted right lower lobe  Abdominal: Soft.  Lymphadenopathy:    She has no cervical adenopathy.  Neurological: She is alert and oriented to person, place, and time.  Skin: Skin is warm and dry.  Psychiatric: She has a  normal mood and affect. Her behavior is normal.      Assessment & Plan:  1. Acute URI - POCT Influenza A/B  2. Acute bronchitis, unspecified organism - predniSONE (DELTASONE) 10 MG tablet; Take 4 tablets once daily by mouth for 2 days, then take 3 tabs once daily X 2 days, then  2 tabs once daily x  2 days, then one tab once daily x 2 days  Dispense: 20 tablet; Refill: 0 Advised patient that Mucinex DM can be used for cough and benzonatate for cough not helped with Mucinex DM.  Increase fluids, rest, and follow up for  further evaluation if symptoms do not improve in 3-4 days.  3. Cough Exam noted fever 100 F and initial heart rate of 111. Retake of HR was 99 which patient stated was elevated for her. Exam noted mild basilar rales in right lower lobe. Chest X-ray to r/o pneumonia versus acute bronchitis. Chest X-ray findings noted that atypical pneumonitis cannot be excluded. Azithromycin provided to cover for possible atypical infection. - DG Chest 2 View; Future

## 2016-01-09 NOTE — Telephone Encounter (Signed)
Pt is aware of her results.  

## 2016-01-11 DIAGNOSIS — Z7689 Persons encountering health services in other specified circumstances: Secondary | ICD-10-CM

## 2016-02-08 ENCOUNTER — Telehealth: Payer: Self-pay | Admitting: Family

## 2016-02-08 NOTE — Telephone Encounter (Signed)
Please see PCP.

## 2016-02-08 NOTE — Telephone Encounter (Signed)
Pt seen 2/20 and dx with bronchitis, poss PNA.  Pt states she still has a productive cough, and sometimes a dry cough. Pt also having back pain in he upper back. Would like to know what Almyra Free would recommend.  [facility] / [place]

## 2016-02-09 ENCOUNTER — Telehealth: Payer: Self-pay | Admitting: Family

## 2016-02-09 NOTE — Telephone Encounter (Signed)
Pt seen 2/20 and dx with bronchitis, poss PNA.  Pt states she still has a productive cough, and sometimes a dry cough. Pt also having back pain in he upper back. Would like to know what Almyra Free would recommend.  [facility] / [place]

## 2016-02-12 NOTE — Telephone Encounter (Signed)
If patient has completed antibiotic that was provided and symptoms have persisted, she should return to the clinic for further evaluation of new symptoms.

## 2016-02-12 NOTE — Telephone Encounter (Signed)
Voicemail was left for pt to return my call   

## 2016-02-12 NOTE — Telephone Encounter (Signed)
Please advise 

## 2016-03-11 ENCOUNTER — Telehealth: Payer: Self-pay | Admitting: Family

## 2016-03-11 NOTE — Telephone Encounter (Signed)
Pt has an appt on Thursday 03/14/16 to see [facility]

## 2016-03-11 NOTE — Telephone Encounter (Signed)
Pt saw Almyra Free on 2/20. Had sx of PNA, and pt states she still has a cough. Pt refused to make an appointment until she spoke with Julie's CMA. Will you please give her a call? Thank you.

## 2016-03-11 NOTE — Telephone Encounter (Signed)
Patient called with symptoms on 02/12/2016 and was advised if she had taken the antibiotic that was prescribed for her and symptoms persist she will need to follow up for further evaluation.  Please verify if antibiotic was completed. If so, she needs to have an office visit

## 2016-03-11 NOTE — Telephone Encounter (Signed)
Pt has a percistant cough. Also pt has an upper back pain

## 2016-03-14 ENCOUNTER — Encounter: Payer: Self-pay | Admitting: Family Medicine

## 2016-03-14 ENCOUNTER — Ambulatory Visit (INDEPENDENT_AMBULATORY_CARE_PROVIDER_SITE_OTHER): Payer: Managed Care, Other (non HMO) | Admitting: Family Medicine

## 2016-03-14 VITALS — BP 130/80 | HR 88 | Temp 98.5°F | Wt 246.8 lb

## 2016-03-14 DIAGNOSIS — R053 Chronic cough: Secondary | ICD-10-CM

## 2016-03-14 DIAGNOSIS — R05 Cough: Secondary | ICD-10-CM

## 2016-03-14 DIAGNOSIS — R12 Heartburn: Secondary | ICD-10-CM | POA: Diagnosis not present

## 2016-03-14 NOTE — Progress Notes (Addendum)
Subjective:    Patient ID: Patricia Pineda, female    DOB: 1966-06-01, 50 y.o.   MRN: [id number]  HPI  [name] is a 50 year old female who presents today with a cough that is persistent for over 2 months. Cough is noted to occur as a "nagging cough" that "comes and goes". Cough is nonproductive but was noted to have yellow sputum 3 weeks ago. On 01/08/16 she was seen for symptoms of acute bronchitis and received a chest X-ray that could not rule out atypical pneumonitis so was treated with azithromycin, and prednisone at that time. She reports that symptoms resolved but cough has persisted. She denies rhinitis, itchy, watery eyes, ear pressure/pain, tooth pain, chest pain, SOB, wheezing, hoarseness, significant dyspepsia, dysphagia, melena, or rectal bleeding. Associated symptoms of sinus pressure/pain, post nasal drip, and "heart burn" are noted.  Triggers for heart burn include caffeine through coffee twice/daily and obesity. Treatments at home include tylenol and antacids with limited benefit if needed. She denies any recent antiacid use or recent sick contact exposure.     Review of Systems  Constitutional: Negative for fever, chills and fatigue.  HENT: Positive for postnasal drip and sinus pressure. Negative for congestion and ear pain.   Respiratory: Positive for cough. Negative for shortness of breath.   Cardiovascular: Negative for chest pain, palpitations and leg swelling.  Gastrointestinal: Negative for nausea, vomiting, abdominal pain, diarrhea and blood in stool.  Genitourinary: Negative for dysuria, urgency, frequency and flank pain.  Musculoskeletal:       Upper back pain that has resolved. Pain occurred after yard work shoveling mulch, moving bricks, and lifting bags of soil  Skin: Negative for rash.  Neurological: Negative for dizziness, numbness and headaches.  Psychiatric/Behavioral:       Denies depressed or anxious mood   No past medical history on file.   Social  History   Social History  . Marital Status: Married    Spouse Name: N/A  . Number of Children: N/A  . Years of Education: N/A   Occupational History  . Not on file.   Social History Main Topics  . Smoking status: Never Smoker   . Smokeless tobacco: Not on file  . Alcohol Use: Yes     Comment: occassionally  . Drug Use: No  . Sexual Activity: Not on file   Other Topics Concern  . Not on file   Social History Narrative    No past surgical history on file.  No family history on file.  No Known Allergies  Current Outpatient Prescriptions on File Prior to Visit  Medication Sig Dispense Refill  . amLODipine (NORVASC) 5 MG tablet TAKE 1 TABLET BY MOUTH DAILY 30 tablet 0  . azithromycin (ZITHROMAX) 250 MG tablet Take 2 tablets by mouth today, then 1 tablet by mouth for 4 days. 6 tablet 0  . simvastatin (ZOCOR) 10 MG tablet TAKE 1 TABLET (10 MG TOTAL) BY MOUTH 3 (THREE) TIMES A WEEK. 15 tablet 5   No current facility-administered medications on file prior to visit.    BP 130/80 mmHg  Pulse 88  Temp(Src) 98.5 F (36.9 C) (Oral)  Wt 246 lb 12.8 oz (111.948 kg)  SpO2 98%       Objective:   Physical Exam  Constitutional: She is oriented to person, place, and time. She appears well-developed and well-nourished.  HENT:  Right Ear: Tympanic membrane normal.  Left Ear: Tympanic membrane normal.  Nose: Rhinorrhea present. Right sinus exhibits  no maxillary sinus tenderness and no frontal sinus tenderness. Left sinus exhibits no maxillary sinus tenderness and no frontal sinus tenderness.  Mouth/Throat: Mucous membranes are normal. No oropharyngeal exudate or posterior oropharyngeal erythema.  Erythematous nasal membranes  Eyes: Pupils are equal, round, and reactive to light.  Neck: Neck supple.  Cardiovascular: Normal rate, regular rhythm and normal heart sounds.  Exam reveals no gallop and no friction rub.   No murmur heard. Pulmonary/Chest: Effort normal and breath sounds  normal. She has no wheezes. She has no rales.  Abdominal: Soft. Bowel sounds are normal.  Lymphadenopathy:    She has no cervical adenopathy.  Neurological: She is alert and oriented to person, place, and time.  Skin: Skin is warm and dry. No rash noted.  Psychiatric: She has a normal mood and affect. Her behavior is normal. Judgment and thought content normal.        Assessment & Plan:  1. Chronic cough Exam and history support post nasal drip and GERD symptoms stimulating cough. Advised treatment of Allegra, Claritin, or Zyrtec with Flonase for allergic rhinitis symptoms.   2. Heart burn Provided information and dietary choices to decrease symptoms of GERD. Weight loss is indicated and discussed with patient to improve symptoms noted. Advised patient to focus on improving dietary choices and avoid eating food 2 hours prior to bedtime. If symptoms do not improve, a trial of Zantac will be considered.  Advised patient to follow up if cough does not improve with treatment recommendations. Also advised patient to schedule a physical for routine wellness maintenance.   Delano Metz, FNP-C

## 2016-03-14 NOTE — Patient Instructions (Addendum)
Postnasal drip and allergic rhinitis symptoms can be treated with Allegra, Claritin, or Zyrtec with Flonase for symptoms as needed.  Triggers for heart burn include caffeine, nicotine, chocolate, carbonated drinks, peppermint, spicy foods, NSAID family (Aspirin, ibuprofen), and eating prior to bedtime. After trial of dietary changes, if symptoms of heart burn are present, Zantac can be used for symptoms with follow up. Dietary information for heart burn is listed below.  Please return to clinic for further evaluation of symptoms and if cough does not improve with  treatment for allergic rhinitis or heart burn. If you are treating heartburn on a daily basis, follow up for further evaluation and management.   Food Choices for Gastroesophageal Reflux Disease, Adult When you have gastroesophageal reflux disease (GERD), the foods you eat and your eating habits are very important. Choosing the right foods can help ease the discomfort of GERD. WHAT GENERAL GUIDELINES DO I NEED TO FOLLOW?  Choose fruits, vegetables, whole grains, low-fat dairy products, and low-fat meat, fish, and poultry.  Limit fats such as oils, salad dressings, butter, nuts, and avocado.  Keep a food diary to identify foods that cause symptoms.  Avoid foods that cause reflux. These may be different for different people.  Eat frequent small meals instead of three large meals each day.  Eat your meals slowly, in a relaxed setting.  Limit fried foods.  Cook foods using methods other than frying.  Avoid drinking alcohol.  Avoid drinking large amounts of liquids with your meals.  Avoid bending over or lying down until 2-3 hours after eating. WHAT FOODS ARE NOT RECOMMENDED? The following are some foods and drinks that may worsen your symptoms: Vegetables Tomatoes. Tomato juice. Tomato and spaghetti sauce. Chili peppers. Onion and garlic. Horseradish. Fruits Oranges, grapefruit, and lemon (fruit and  juice). Meats High-fat meats, fish, and poultry. This includes hot dogs, ribs, ham, sausage, salami, and bacon. Dairy Whole milk and chocolate milk. Sour cream. Cream. Butter. Ice cream. Cream cheese.  Beverages Coffee and tea, with or without caffeine. Carbonated beverages or energy drinks. Condiments Hot sauce. Barbecue sauce.  Sweets/Desserts Chocolate and cocoa. Donuts. Peppermint and spearmint. Fats and Oils High-fat foods, including [place] fries and potato chips. Other Vinegar. Strong spices, such as black pepper, white pepper, red pepper, cayenne, curry powder, cloves, ginger, and chili powder. The items listed above may not be a complete list of foods and beverages to avoid. Contact your dietitian for more information.   This information is not intended to replace advice given to you by your health care provider. Make sure you discuss any questions you have with your health care provider.   Document Released: 11/04/2005 Document Revised: 11/25/2014 Document Reviewed: 09/08/2013 Elsevier Interactive Patient Education [employer].

## 2016-08-02 ENCOUNTER — Other Ambulatory Visit: Payer: Self-pay | Admitting: Family

## 2016-08-02 NOTE — Telephone Encounter (Signed)
Rx refill sent to pharmacy. 

## 2016-08-09 ENCOUNTER — Other Ambulatory Visit: Payer: Self-pay | Admitting: *Deleted

## 2016-08-09 NOTE — Telephone Encounter (Signed)
Patient has seen Patricia Pineda for a sick visit.  Patient does not have an upcoming appt with a new PCP and last Rx was given by [place] over a year ago.  What can be done for a refill?

## 2016-08-13 NOTE — Telephone Encounter (Signed)
Left message for patient to return phone call.  I was advised by Sharyn Lull to contact patient and notify them that they need to make an appointment to establish care with a provider so we can proceed with further refills.  I would not like patient to go without this medication, and I will attempt to call back later today.

## 2016-08-19 ENCOUNTER — Other Ambulatory Visit: Payer: Self-pay

## 2016-08-19 MED ORDER — SIMVASTATIN 10 MG PO TABS: ORAL_TABLET | ORAL | 0 refills | Status: DC

## 2016-09-04 NOTE — Patient Instructions (Addendum)
Your procedure is scheduled on:  Tuesday, Oct. 31, 2017  Enter through the Main Entrance of [facility] at:  6:00 AM  Pick up the phone at the desk and dial [phone].  Call this number if you have problems the morning of surgery: [phone].  Remember: Do NOT eat food or drink after:  Midnight Monday, Oct. 30, 2017  Take these medicines the morning of surgery with a SIP OF WATER:  None Stop ALL herbal medications at this time   Do NOT wear jewelry (body piercing), metal hair clips/bobby pins, make-up, or nail polish. Do NOT wear lotions, powders, or perfumes.  You may wear deodorant. Do NOT shave for 48 hours prior to surgery. Do NOT bring valuables to the hospital. Contacts, dentures, or bridgework may not be worn into surgery.  Leave suitcase in car.  After surgery it may be brought to your room.  For patients admitted to the hospital, checkout time is 11:00 AM the day of discharge.

## 2016-09-05 ENCOUNTER — Encounter (HOSPITAL_COMMUNITY): Payer: Self-pay

## 2016-09-05 ENCOUNTER — Encounter (HOSPITAL_COMMUNITY)
Admission: RE | Admit: 2016-09-05 | Discharge: 2016-09-05 | Disposition: A | Discharge status: Home / Self Care | Payer: Managed Care, Other (non HMO) | Source: Ambulatory Visit | Attending: Obstetrics and Gynecology | Admitting: Obstetrics and Gynecology

## 2016-09-05 ENCOUNTER — Other Ambulatory Visit: Payer: Self-pay

## 2016-09-05 DIAGNOSIS — Z01810 Encounter for preprocedural cardiovascular examination: Secondary | ICD-10-CM | POA: Diagnosis present

## 2016-09-05 DIAGNOSIS — R9431 Abnormal electrocardiogram [ECG] [EKG]: Secondary | ICD-10-CM | POA: Diagnosis not present

## 2016-09-05 DIAGNOSIS — Z01812 Encounter for preprocedural laboratory examination: Secondary | ICD-10-CM | POA: Insufficient documentation

## 2016-09-05 HISTORY — DX: Essential (primary) hypertension: I10

## 2016-09-05 HISTORY — DX: Pneumonia, unspecified organism: J18.9

## 2016-09-05 HISTORY — DX: Pure hypercholesterolemia, unspecified: E78.00

## 2016-09-05 HISTORY — DX: Unspecified osteoarthritis, unspecified site: M19.90

## 2016-09-05 HISTORY — DX: Anemia, unspecified: D64.9

## 2016-09-05 LAB — BASIC METABOLIC PANEL
ANION GAP: 5 (ref 5–15)
BUN: 10 mg/dL (ref 6–20)
CHLORIDE: 105 mmol/L (ref 101–111)
CO2: 28 mmol/L (ref 22–32)
Calcium: 9.2 mg/dL (ref 8.9–10.3)
Creatinine, Ser: 0.65 mg/dL (ref 0.44–1.00)
GFR calc Af Amer: >60 mL/min (ref >60)
GLUCOSE: 90 mg/dL (ref 65–99)
POTASSIUM: 3.6 mmol/L (ref 3.5–5.1)
SODIUM: 138 mmol/L (ref 135–145)

## 2016-09-05 LAB — CBC
HCT: 28.9 % — ABNORMAL LOW (ref 36.0–46.0)
HEMOGLOBIN: 8.3 g/dL — AB (ref 12.0–15.0)
MCH: 17.8 pg — ABNORMAL LOW (ref 26.0–34.0)
MCHC: 28.7 g/dL — ABNORMAL LOW (ref 30.0–36.0)
MCV: 62 fL — AB (ref 78.0–100.0)
PLATELETS: 599 10*3/uL — AB (ref 150–400)
RBC: 4.66 MIL/uL (ref 3.87–5.11)
RDW: 18.5 % — ABNORMAL HIGH (ref 11.5–15.5)
WBC: 8.5 10*3/uL (ref 4.0–10.5)

## 2016-09-05 LAB — ABO/RH: ABO/RH(D): O POS

## 2016-09-05 NOTE — Pre-Procedure Instructions (Signed)
Dr. Lamarr Lulas reviewed EKG, no new orders received at this time.

## 2016-09-16 ENCOUNTER — Encounter (HOSPITAL_COMMUNITY): Payer: Self-pay | Admitting: Anesthesiology

## 2016-09-16 MED ORDER — DEXTROSE 5 % IV SOLN
2.0000 g | INTRAVENOUS | Status: AC
  Administered 2016-09-17: 2 g via INTRAVENOUS
  Filled 2016-09-16: qty 2

## 2016-09-16 NOTE — H&P (Addendum)
Patricia Pineda is an 50 y.o. female presents for surgical mngt of menorrhagia, fibroids and left ovarian cyst.    Menstrual History: Patient's last menstrual period was 08/17/2016 (exact date).    Past Medical History:  Diagnosis Date  . Anemia   . Arthritis    knees  . High cholesterol   . Hypertension   . Pneumonia     Past Surgical History:  Procedure Laterality Date  . CESAREAN SECTION    BTL  No family history on file.  Social History:  reports that she has never smoked. She has never used smokeless tobacco. She reports that she drinks alcohol. She reports that she does not use drugs.  Allergies: No Known Allergies Meds:  Simvistatin, Norvasc  ROS  Last menstrual period 08/17/2016. Physical Exam  Gen - NAD CV RRR  Lungs - clear Abd - NT/ND Ext - no edema PV - 16 wks size uterus.  PV [place]:  Multiple uterine fibroids, 15cm left adnexal cyst  Assessment/Plan:  Menorrhagia, fibroids, left ovarian cyst TAH/BS/left oophorectomy R/b/a discussed, questions answered, informed consent  Dorothyann Mourer 09/16/2016, 1:57 PM

## 2016-09-16 NOTE — Anesthesia Preprocedure Evaluation (Addendum)
Anesthesia Evaluation  Patient identified by MRN, date of birth, ID band Patient awake    Reviewed: Allergy & Precautions, NPO status , Patient's Chart, lab work & pertinent test results  History of Anesthesia Complications Negative for: history of anesthetic complications  Airway Mallampati: III  TM Distance: >3 FB Neck ROM: Full    Dental  (+) Teeth Intact, Dental Advisory Given   Pulmonary neg pulmonary ROS, neg shortness of breath, neg sleep apnea, neg COPD, neg recent URI,    Pulmonary exam normal breath sounds clear to auscultation       Cardiovascular hypertension, Pt. on medications (-) angina(-) Past MI, (-) Cardiac Stents and (-) CABG  Rhythm:Regular Rate:Normal  HLD   Neuro/Psych negative neurological ROS     GI/Hepatic Neg liver ROS, GERD  Controlled,  Endo/Other  negative endocrine ROS  Renal/GU negative Renal ROS     Musculoskeletal  (+) Arthritis ,   Abdominal (+) + obese,   Peds  Hematology  (+) Blood dyscrasia (Hgb 8.3), anemia ,   Anesthesia Other Findings   Reproductive/Obstetrics                            Anesthesia Physical Anesthesia Plan  ASA: III  Anesthesia Plan: General   Post-op Pain Management:    Induction: Intravenous  Airway Management Planned: Oral ETT  Additional Equipment:   Intra-op Plan:   Post-operative Plan: Extubation in OR  Informed Consent: I have reviewed the patients History and Physical, chart, labs and discussed the procedure including the risks, benefits and alternatives for the proposed anesthesia with the patient or authorized representative who has indicated his/her understanding and acceptance.   Dental advisory given  Plan Discussed with: CRNA  Anesthesia Plan Comments: (Risks of general anesthesia discussed including, but not limited to, sore throat, hoarse voice, chipped/damaged teeth, injury to vocal cords, nausea and  vomiting, allergic reactions, lung infection, heart attack, stroke, and death. All questions answered. )      Anesthesia Quick Evaluation

## 2016-09-17 ENCOUNTER — Inpatient Hospital Stay (HOSPITAL_COMMUNITY): Payer: Managed Care, Other (non HMO) | Admitting: Anesthesiology

## 2016-09-17 ENCOUNTER — Encounter (HOSPITAL_COMMUNITY)
Admission: RE | Disposition: A | Discharge status: Home / Self Care | Payer: Self-pay | Source: Ambulatory Visit | Attending: Obstetrics and Gynecology

## 2016-09-17 ENCOUNTER — Inpatient Hospital Stay (HOSPITAL_COMMUNITY)
Admission: RE | Admit: 2016-09-17 | Discharge: 2016-09-19 | DRG: 743 | Disposition: A | Discharge status: Home / Self Care | Payer: Managed Care, Other (non HMO) | Source: Ambulatory Visit | Attending: Obstetrics and Gynecology | Admitting: Obstetrics and Gynecology

## 2016-09-17 ENCOUNTER — Encounter (HOSPITAL_COMMUNITY): Payer: Self-pay

## 2016-09-17 DIAGNOSIS — N920 Excessive and frequent menstruation with regular cycle: Principal | ICD-10-CM | POA: Diagnosis present

## 2016-09-17 DIAGNOSIS — I10 Essential (primary) hypertension: Secondary | ICD-10-CM | POA: Diagnosis present

## 2016-09-17 DIAGNOSIS — N83202 Unspecified ovarian cyst, left side: Secondary | ICD-10-CM | POA: Diagnosis present

## 2016-09-17 DIAGNOSIS — D259 Leiomyoma of uterus, unspecified: Secondary | ICD-10-CM | POA: Diagnosis present

## 2016-09-17 DIAGNOSIS — K219 Gastro-esophageal reflux disease without esophagitis: Secondary | ICD-10-CM | POA: Diagnosis present

## 2016-09-17 HISTORY — PX: HYSTERECTOMY ABDOMINAL WITH SALPINGECTOMY: SHX6725

## 2016-09-17 HISTORY — PX: ABDOMINAL HYSTERECTOMY: SHX81

## 2016-09-17 LAB — PREGNANCY, URINE: Preg Test, Ur: NEGATIVE

## 2016-09-17 LAB — PREPARE RBC (CROSSMATCH)

## 2016-09-17 SURGERY — HYSTERECTOMY, TOTAL, ABDOMINAL, WITH SALPINGECTOMY
Anesthesia: General | Site: Abdomen | Laterality: Bilateral

## 2016-09-17 MED ORDER — LIDOCAINE HCL (CARDIAC) 20 MG/ML IV SOLN
INTRAVENOUS | Status: AC
  Filled 2016-09-17: qty 5

## 2016-09-17 MED ORDER — GLYCOPYRROLATE 0.2 MG/ML IJ SOLN
INTRAMUSCULAR | Status: AC
  Filled 2016-09-17: qty 4

## 2016-09-17 MED ORDER — LIDOCAINE HCL (CARDIAC) 20 MG/ML IV SOLN
INTRAVENOUS | Status: DC | PRN
  Administered 2016-09-17: 100 mg via INTRAVENOUS

## 2016-09-17 MED ORDER — MENTHOL 3 MG MT LOZG: 1.0000 | LOZENGE | OROMUCOSAL | Status: DC | PRN

## 2016-09-17 MED ORDER — ONDANSETRON HCL 4 MG PO TABS: 4.0000 mg | ORAL_TABLET | Freq: Four times a day (QID) | ORAL | Status: DC | PRN

## 2016-09-17 MED ORDER — AMLODIPINE BESYLATE 5 MG PO TABS
5.0000 mg | ORAL_TABLET | Freq: Every day | ORAL | Status: DC
  Filled 2016-09-17: qty 1

## 2016-09-17 MED ORDER — PNEUMOCOCCAL VAC POLYVALENT 25 MCG/0.5ML IJ INJ
0.5000 mL | INJECTION | INTRAMUSCULAR | Status: DC
  Filled 2016-09-17: qty 0.5

## 2016-09-17 MED ORDER — PROPOFOL 10 MG/ML IV BOLUS
INTRAVENOUS | Status: DC | PRN
  Administered 2016-09-17: 200 mg via INTRAVENOUS

## 2016-09-17 MED ORDER — SODIUM CHLORIDE 0.9% FLUSH: 9.0000 mL | INTRAVENOUS | Status: DC | PRN

## 2016-09-17 MED ORDER — HYDROMORPHONE HCL 1 MG/ML IJ SOLN
INTRAMUSCULAR | Status: DC | PRN
  Administered 2016-09-17 (×2): 1 mg via INTRAVENOUS

## 2016-09-17 MED ORDER — NEOSTIGMINE METHYLSULFATE 10 MG/10ML IV SOLN
INTRAVENOUS | Status: AC
  Filled 2016-09-17: qty 1

## 2016-09-17 MED ORDER — 0.9 % SODIUM CHLORIDE (POUR BTL) OPTIME
TOPICAL | Status: DC | PRN
  Administered 2016-09-17: 1000 mL

## 2016-09-17 MED ORDER — SUGAMMADEX SODIUM 200 MG/2ML IV SOLN
INTRAVENOUS | Status: DC | PRN
  Administered 2016-09-17: 200 mg via INTRAVENOUS

## 2016-09-17 MED ORDER — ONDANSETRON HCL 4 MG/2ML IJ SOLN: 4.0000 mg | Freq: Four times a day (QID) | INTRAMUSCULAR | Status: DC | PRN

## 2016-09-17 MED ORDER — DEXAMETHASONE SODIUM PHOSPHATE 4 MG/ML IJ SOLN
INTRAMUSCULAR | Status: DC | PRN
  Administered 2016-09-17: 10 mg via INTRAVENOUS

## 2016-09-17 MED ORDER — PROMETHAZINE HCL 25 MG/ML IJ SOLN: 6.2500 mg | INTRAMUSCULAR | Status: DC | PRN

## 2016-09-17 MED ORDER — DEXAMETHASONE SODIUM PHOSPHATE 10 MG/ML IJ SOLN
INTRAMUSCULAR | Status: AC
Start: 2016-09-17 — End: 2016-09-17
  Filled 2016-09-17: qty 1

## 2016-09-17 MED ORDER — OXYCODONE-ACETAMINOPHEN 5-325 MG PO TABS
1.0000 | ORAL_TABLET | ORAL | Status: DC | PRN
  Administered 2016-09-18 (×2): 2 via ORAL
  Administered 2016-09-18: 1 via ORAL
  Administered 2016-09-19: 2 via ORAL
  Filled 2016-09-17 (×2): qty 2
  Filled 2016-09-17: qty 1
  Filled 2016-09-17: qty 2

## 2016-09-17 MED ORDER — SCOPOLAMINE 1 MG/3DAYS TD PT72
1.0000 | MEDICATED_PATCH | Freq: Once | TRANSDERMAL | Status: DC
  Administered 2016-09-17: 1.5 mg via TRANSDERMAL

## 2016-09-17 MED ORDER — HYDROMORPHONE HCL 1 MG/ML IJ SOLN
INTRAMUSCULAR | Status: AC
  Filled 2016-09-17: qty 1

## 2016-09-17 MED ORDER — FENTANYL CITRATE (PF) 100 MCG/2ML IJ SOLN: 25.0000 ug | INTRAMUSCULAR | Status: DC | PRN

## 2016-09-17 MED ORDER — MIDAZOLAM HCL 2 MG/2ML IJ SOLN
INTRAMUSCULAR | Status: AC
  Filled 2016-09-17: qty 2

## 2016-09-17 MED ORDER — DIPHENHYDRAMINE HCL 50 MG/ML IJ SOLN: 12.5000 mg | Freq: Four times a day (QID) | INTRAMUSCULAR | Status: DC | PRN

## 2016-09-17 MED ORDER — ROCURONIUM BROMIDE 100 MG/10ML IV SOLN
INTRAVENOUS | Status: DC | PRN
  Administered 2016-09-17: 40 mg via INTRAVENOUS
  Administered 2016-09-17 (×2): 10 mg via INTRAVENOUS

## 2016-09-17 MED ORDER — KETOROLAC TROMETHAMINE 30 MG/ML IJ SOLN
INTRAMUSCULAR | Status: DC | PRN
  Administered 2016-09-17: 30 mg via INTRAVENOUS

## 2016-09-17 MED ORDER — ONDANSETRON HCL 4 MG/2ML IJ SOLN
INTRAMUSCULAR | Status: DC | PRN
  Administered 2016-09-17: 4 mg via INTRAVENOUS

## 2016-09-17 MED ORDER — ROCURONIUM BROMIDE 100 MG/10ML IV SOLN
INTRAVENOUS | Status: AC
  Filled 2016-09-17: qty 1

## 2016-09-17 MED ORDER — SCOPOLAMINE 1 MG/3DAYS TD PT72
MEDICATED_PATCH | TRANSDERMAL | Status: AC
  Administered 2016-09-17: 1.5 mg via TRANSDERMAL
  Filled 2016-09-17: qty 1

## 2016-09-17 MED ORDER — LACTATED RINGERS IV SOLN
INTRAVENOUS | Status: DC
  Administered 2016-09-17 (×3): via INTRAVENOUS

## 2016-09-17 MED ORDER — GLYCOPYRROLATE 0.2 MG/ML IJ SOLN
INTRAMUSCULAR | Status: DC | PRN
  Administered 2016-09-17: 0.1 mg via INTRAVENOUS

## 2016-09-17 MED ORDER — MIDAZOLAM HCL 2 MG/2ML IJ SOLN
INTRAMUSCULAR | Status: DC | PRN
  Administered 2016-09-17 (×2): 1 mg via INTRAVENOUS

## 2016-09-17 MED ORDER — NALOXONE HCL 0.4 MG/ML IJ SOLN: 0.4000 mg | INTRAMUSCULAR | Status: DC | PRN

## 2016-09-17 MED ORDER — HYDROMORPHONE 1 MG/ML IV SOLN
INTRAVENOUS | Status: DC
  Administered 2016-09-17: 11:00:00 via INTRAVENOUS
  Administered 2016-09-17: 5.2 mg via INTRAVENOUS
  Administered 2016-09-17: 3.4 mL via INTRAVENOUS
  Administered 2016-09-17: 5.6 mL via INTRAVENOUS
  Administered 2016-09-18: 1.6 mg via INTRAVENOUS
  Administered 2016-09-18: 1.8 mg via INTRAVENOUS
  Administered 2016-09-18: 1.2 mg via INTRAVENOUS
  Filled 2016-09-17: qty 25

## 2016-09-17 MED ORDER — DIPHENHYDRAMINE HCL 12.5 MG/5ML PO ELIX: 12.5000 mg | ORAL_SOLUTION | Freq: Four times a day (QID) | ORAL | Status: DC | PRN

## 2016-09-17 MED ORDER — PROPOFOL 10 MG/ML IV BOLUS
INTRAVENOUS | Status: AC
  Filled 2016-09-17: qty 20

## 2016-09-17 MED ORDER — FENTANYL CITRATE (PF) 250 MCG/5ML IJ SOLN
INTRAMUSCULAR | Status: AC
  Filled 2016-09-17: qty 5

## 2016-09-17 MED ORDER — DEXTROSE-NACL 5-0.9 % IV SOLN
INTRAVENOUS | Status: DC
  Administered 2016-09-17 – 2016-09-18 (×3): via INTRAVENOUS

## 2016-09-17 MED ORDER — FENTANYL CITRATE (PF) 100 MCG/2ML IJ SOLN
INTRAMUSCULAR | Status: DC | PRN
  Administered 2016-09-17: 50 ug via INTRAVENOUS
  Administered 2016-09-17: 100 ug via INTRAVENOUS
  Administered 2016-09-17 (×2): 50 ug via INTRAVENOUS

## 2016-09-17 MED ORDER — KETOROLAC TROMETHAMINE 30 MG/ML IJ SOLN
INTRAMUSCULAR | Status: AC
  Filled 2016-09-17: qty 1

## 2016-09-17 MED ORDER — KETOROLAC TROMETHAMINE 30 MG/ML IJ SOLN
30.0000 mg | Freq: Three times a day (TID) | INTRAMUSCULAR | Status: AC
  Administered 2016-09-17 – 2016-09-18 (×2): 30 mg via INTRAVENOUS
  Filled 2016-09-17 (×2): qty 1

## 2016-09-17 MED ORDER — SIMVASTATIN 10 MG PO TABS
10.0000 mg | ORAL_TABLET | Freq: Every day | ORAL | Status: DC
  Administered 2016-09-17 – 2016-09-18 (×2): 10 mg via ORAL
  Filled 2016-09-17 (×2): qty 1

## 2016-09-17 MED ORDER — INFLUENZA VAC SPLIT QUAD 0.5 ML IM SUSY: 0.5000 mL | PREFILLED_SYRINGE | INTRAMUSCULAR | Status: DC

## 2016-09-17 MED ORDER — SODIUM CHLORIDE 0.9 % IV SOLN: 10.0000 mL/h | Freq: Once | INTRAVENOUS | Status: DC

## 2016-09-17 MED ORDER — ONDANSETRON HCL 4 MG/2ML IJ SOLN
INTRAMUSCULAR | Status: AC
  Filled 2016-09-17: qty 2

## 2016-09-17 MED ORDER — PHENYLEPHRINE HCL 10 MG/ML IJ SOLN
INTRAMUSCULAR | Status: DC | PRN
  Administered 2016-09-17: 80 ug via INTRAVENOUS
  Administered 2016-09-17: 40 ug via INTRAVENOUS

## 2016-09-17 SURGICAL SUPPLY — 35 items
CANISTER SUCT 3000ML (MISCELLANEOUS) ×3 IMPLANT
CLOTH BEACON ORANGE TIMEOUT ST (SAFETY) ×3 IMPLANT
CONT PATH 16OZ SNAP LID 3702 (MISCELLANEOUS) ×3 IMPLANT
DECANTER SPIKE VIAL GLASS SM (MISCELLANEOUS) IMPLANT
DRAPE CESAREAN BIRTH W POUCH (DRAPES) ×3 IMPLANT
DRAPE WARM FLUID 44X44 (DRAPE) ×3 IMPLANT
DRSG OPSITE POSTOP 4X10 (GAUZE/BANDAGES/DRESSINGS) ×3 IMPLANT
DURAPREP 26ML APPLICATOR (WOUND CARE) ×3 IMPLANT
GAUZE SPONGE 4X4 16PLY XRAY LF (GAUZE/BANDAGES/DRESSINGS) ×3 IMPLANT
GLOVE BIO SURGEON STRL SZ 6.5 (GLOVE) ×2 IMPLANT
GLOVE BIO SURGEONS STRL SZ 6.5 (GLOVE) ×1
GLOVE BIOGEL PI IND STRL 7.0 (GLOVE) ×4 IMPLANT
GLOVE BIOGEL PI INDICATOR 7.0 (GLOVE) ×8
GOWN STRL REUS W/TWL LRG LVL3 (GOWN DISPOSABLE) ×9 IMPLANT
HEMOSTAT SURGICEL 4X8 (HEMOSTASIS) IMPLANT
NS IRRIG 1000ML POUR BTL (IV SOLUTION) ×3 IMPLANT
PACK ABDOMINAL GYN (CUSTOM PROCEDURE TRAY) ×3 IMPLANT
PAD OB MATERNITY 4.3X12.25 (PERSONAL CARE ITEMS) ×3 IMPLANT
PROTECTOR NERVE ULNAR (MISCELLANEOUS) ×3 IMPLANT
RETRACTOR TRAXI PANNICULUS (MISCELLANEOUS) ×1 IMPLANT
SPONGE LAP 18X18 X RAY DECT (DISPOSABLE) ×3 IMPLANT
STAPLER VISISTAT 35W (STAPLE) IMPLANT
SUT MNCRL 0 MO-4 VIOLET 18 CR (SUTURE) ×3 IMPLANT
SUT MNCRL 0 VIOLET 6X18 (SUTURE) ×1 IMPLANT
SUT MON AB-0 CT1 36 (SUTURE) ×3 IMPLANT
SUT MONOCRYL 0 6X18 (SUTURE) ×2
SUT MONOCRYL 0 MO 4 18  CR/8 (SUTURE) ×6
SUT PDS AB 0 CTX 60 (SUTURE) ×3 IMPLANT
SUT PLAIN 2 0 XLH (SUTURE) ×3 IMPLANT
SUT VIC AB 3-0 X1 27 (SUTURE) IMPLANT
SUT VIC AB 4-0 KS 27 (SUTURE) IMPLANT
TOWEL OR 17X24 6PK STRL BLUE (TOWEL DISPOSABLE) ×6 IMPLANT
TRAXI PANNICULUS RETRACTOR (MISCELLANEOUS) ×2
TRAY FOLEY CATH SILVER 14FR (SET/KITS/TRAYS/PACK) ×3 IMPLANT
WATER STERILE IRR 1000ML POUR (IV SOLUTION) ×3 IMPLANT

## 2016-09-17 NOTE — Progress Notes (Signed)
Day of Surgery Procedure(s) (LRB): HYSTERECTOMY ABDOMINAL WITH BILATERAL SALPINGECTOMY, LEFT OOPHORECTOMY (Bilateral)  Subjective: Patient reports no complaints.  Pain controlled with IV PCA.    Objective: I have reviewed patient's vital signs, intake and output, medications and labs.  Gen - NAD, alert & oriented Abd - soft, NT/ND Ext - NT   Assessment: s/p Procedure(s): HYSTERECTOMY ABDOMINAL WITH BILATERAL SALPINGECTOMY, LEFT OOPHORECTOMY (Bilateral): progressing well  Plan: Advance diet Encourage ambulation add toradol  CBC in am  LOS: 0 days    Zelma Mazariego 09/17/2016, 1:16 PM

## 2016-09-17 NOTE — Anesthesia Postprocedure Evaluation (Signed)
Anesthesia Post Note  Patient: Patricia Pineda  Procedure(s) Performed: Procedure(s) (LRB): HYSTERECTOMY ABDOMINAL WITH BILATERAL SALPINGECTOMY, LEFT OOPHORECTOMY (Bilateral)  Patient location during evaluation: PACU Anesthesia Type: General Level of consciousness: awake and alert Pain management: pain level controlled Vital Signs Assessment: post-procedure vital signs reviewed and stable Respiratory status: spontaneous breathing, nonlabored ventilation and respiratory function stable Cardiovascular status: blood pressure returned to baseline and stable Postop Assessment: no signs of nausea or vomiting Anesthetic complications: no     Last Vitals:  Vitals:   09/17/16 1000 09/17/16 1015  BP: 118/73 119/76  Pulse: 77 76  Resp: 10 11  Temp:      Last Pain:  Vitals:   09/17/16 1000  TempSrc:   PainSc: 3    Pain Goal: Patients Stated Pain Goal: 3 (09/17/16 1000)               Nilda Simmer

## 2016-09-17 NOTE — Op Note (Signed)
Dictation [phone]

## 2016-09-17 NOTE — Addendum Note (Signed)
Addendum  created 09/17/16 1351 by Hewitt Blade, CRNA   Sign clinical note

## 2016-09-17 NOTE — Transfer of Care (Signed)
Immediate Anesthesia Transfer of Care Note  Patient: Patricia Pineda  Procedure(s) Performed: Procedure(s): HYSTERECTOMY ABDOMINAL WITH BILATERAL SALPINGECTOMY, LEFT OOPHORECTOMY (Bilateral)  Patient Location: PACU  Anesthesia Type:General  Level of Consciousness: awake, alert  and oriented  Airway & Oxygen Therapy: Patient Spontanous Breathing and Patient connected to nasal cannula oxygen  Post-op Assessment: Report given to RN and Post -op Vital signs reviewed and stable  Post vital signs: Reviewed and stable  Last Vitals:  Vitals:   09/17/16 0610 09/17/16 0926  BP: 132/83   Pulse: 79   Resp: 20   Temp: 37.2 C 37 C    Last Pain:  Vitals:   09/17/16 0610  TempSrc: Oral      Patients Stated Pain Goal: 3 ([id number])  Complications: No apparent anesthesia complications

## 2016-09-17 NOTE — Anesthesia Postprocedure Evaluation (Signed)
Anesthesia Post Note  Patient: Patricia Pineda  Procedure(s) Performed: Procedure(s) (LRB): HYSTERECTOMY ABDOMINAL WITH BILATERAL SALPINGECTOMY, LEFT OOPHORECTOMY (Bilateral)  Patient location during evaluation: [facility] Anesthesia Type: General Level of consciousness: awake and alert Pain management: pain level controlled Vital Signs Assessment: post-procedure vital signs reviewed and stable Respiratory status: spontaneous breathing, nonlabored ventilation and patient connected to nasal cannula oxygen Cardiovascular status: stable Postop Assessment: adequate PO intake and no signs of nausea or vomiting Anesthetic complications: no     Last Vitals:  Vitals:   09/17/16 1150 09/17/16 1250  BP: 116/68 118/74  Pulse: 81 87  Resp: 13 16  Temp: 36.8 C 37.1 C    Last Pain:  Vitals:   09/17/16 1250  TempSrc: Oral  PainSc:    Pain Goal: Patients Stated Pain Goal: 1 (09/17/16 1200)               Latash Nouri Hristova

## 2016-09-17 NOTE — Anesthesia Procedure Notes (Signed)
Procedure Name: Intubation Date/Time: 09/17/2016 7:37 AM Performed by: Flossie Dibble Pre-anesthesia Checklist: Patient identified, Emergency Drugs available, Suction available, Patient being monitored and Timeout performed Patient Re-evaluated:Patient Re-evaluated prior to inductionOxygen Delivery Method: Circle system utilized Preoxygenation: Pre-oxygenation with 100% oxygen Intubation Type: IV induction Ventilation: Oral airway inserted - appropriate to patient size and Mask ventilation without difficulty Laryngoscope Size: Mac and 3 Grade View: Grade I Tube type: Oral Tube size: 7.0 mm Number of attempts: 1 Airway Equipment and Method: Patient positioned with wedge pillow and Stylet Placement Confirmation: ETT inserted through vocal cords under direct vision,  positive ETCO2 and breath sounds checked- equal and bilateral Secured at: 22 cm Tube secured with: Tape Dental Injury: Teeth and Oropharynx as per pre-operative assessment

## 2016-09-18 ENCOUNTER — Encounter (HOSPITAL_COMMUNITY): Payer: Self-pay | Admitting: Obstetrics and Gynecology

## 2016-09-18 LAB — CBC
HCT: 24.8 % — ABNORMAL LOW (ref 36.0–46.0)
Hemoglobin: 7.3 g/dL — ABNORMAL LOW (ref 12.0–15.0)
MCH: 18.3 pg — ABNORMAL LOW (ref 26.0–34.0)
MCHC: 29.4 g/dL — ABNORMAL LOW (ref 30.0–36.0)
MCV: 62 fL — ABNORMAL LOW (ref 78.0–100.0)
PLATELETS: 482 10*3/uL — AB (ref 150–400)
RBC: 4 MIL/uL (ref 3.87–5.11)
RDW: 18.8 % — AB (ref 11.5–15.5)
WBC: 13.4 10*3/uL — AB (ref 4.0–10.5)

## 2016-09-18 MED ORDER — PNEUMOCOCCAL VAC POLYVALENT 25 MCG/0.5ML IJ INJ
0.5000 mL | INJECTION | INTRAMUSCULAR | Status: DC
  Filled 2016-09-18: qty 0.5

## 2016-09-18 MED ORDER — INFLUENZA VAC SPLIT QUAD 0.5 ML IM SUSY: 0.5000 mL | PREFILLED_SYRINGE | INTRAMUSCULAR | Status: DC

## 2016-09-18 MED ORDER — IBUPROFEN 600 MG PO TABS
600.0000 mg | ORAL_TABLET | Freq: Four times a day (QID) | ORAL | Status: DC | PRN
  Administered 2016-09-18 – 2016-09-19 (×3): 600 mg via ORAL
  Filled 2016-09-18 (×3): qty 1

## 2016-09-18 NOTE — Progress Notes (Signed)
1 Day Post-Op Procedure(s) (LRB): HYSTERECTOMY ABDOMINAL WITH BILATERAL SALPINGECTOMY, LEFT OOPHORECTOMY (Bilateral)  Subjective: Patient reports tolerating PO, + flatus and no problems voiding.    Objective: I have reviewed patient's vital signs, intake and output, medications and labs.  General: alert and cooperative GI: normal findings: soft, non-tender Extremities: extremities normal, atraumatic, no cyanosis or edema Vaginal Bleeding: none  Assessment: s/p Procedure(s): HYSTERECTOMY ABDOMINAL WITH BILATERAL SALPINGECTOMY, LEFT OOPHORECTOMY (Bilateral): stable and progressing well  Plan: Advance diet Encourage ambulation Advance to PO medication Discontinue IV fluids  LOS: 1 day    Patricia Pineda 09/18/2016, 12:38 PM

## 2016-09-18 NOTE — Op Note (Signed)
NAMEANJI, TERMAN NO.:  [id number]  MEDICAL RECORD NO.:  [id number]  LOCATION:  [place]                           FACILITY:  [facility]  PHYSICIAN:  Marylynn Pearson, MD    DATE OF BIRTH:  1966-06-11  DATE OF PROCEDURE:  09/17/2016 DATE OF DISCHARGE:  09/05/2016                              OPERATIVE REPORT   PREOPERATIVE DIAGNOSES: 1. Fibroids/menorrhagia. 2. Left ovarian cyst.  PROCEDURES:  Total abdominal hysterectomy, bilateral salpingectomy, and left oophorectomy.  ASSISTANT:  Darlyn Chamber, M.D.  BLOOD LOSS:  300 mL.  URINE OUTPUT:  200 mL, clear.  SPECIMEN:  Uterus, bilateral fallopian tubes and left ovary.  COMPLICATIONS:  None.  CONDITION:  Stable to recovery room.  ANESTHESIA:  General.  DETAILS OF PROCEDURE:  The patient was taken to the operating room after informed consent was obtained.  She was placed in the supine position, given general anesthesia, prepped and draped in sterile fashion and a Foley catheter was inserted sterilely.  __________ device was used to retract the panniculus.  A Pfannenstiel skin incision was made with a scalpel and extended to the fascia.  The fascia was then incised in the midline, extended laterally using curved Mayo scissors.  Peritoneum was identified and entered sharply.  This was extended superiorly and inferiorly.  A large left adnexal cyst was identified and delivered through the incision.  It was noted to be __________ the left tube and ovary.  The left uteroovarian ligament were developed and doubly clamped, excised and suture ligated.  The left infundibulopelvic ligament was doubly clamped.  The left adnexa were excised and passed off to be sent to pathology.  It did rupture just above the incision. When it was being passed off, pedicle was doubly suture ligated and noted to be hemostatic.  Our attention was then turned to the uterus, which was grasped with a toothed tenaculum and delivered through  the incision.  The right ovary was found to appear normal.  A Fredia Sorrow retractor was placed in the abdomen.  Bladder blade was placed, and the bowels were packed with moist lap sponge.  Bilateral round ligaments were grasped, cut, and suture ligated.  The anterior peritoneum was then developed and the bladder was dissected off the lower uterine segment and cervix.  The right uteroovarian ligament was clamped, cut, and suture ligated.  The bilateral uterine arteries were clamped, cut, and suture ligated.  Cardinal ligaments and uterosacral ligaments were clamped, cut, and suture ligated in similar fashion.  The uterus was then amputated from the vagina and passed off to be sent to pathology.  The vaginal cuff was reapproximated using a series of figure- of-eight sutures.  Excellent hemostasis was noted.  The pelvis was copiously irrigated and all pedicles were inspected and found to be hemostatic.  All retractors and sponges were removed from the abdomen. The peritoneum was closed with Monocryl.  The fascia was closed with PDS.  Subcuticular plain gut suture was placed and the skin was closed with staples.  She was extubated and taken to the recovery room in stable condition.  Sponge, lap, needle, and instrument counts were correct x2.  Marylynn Pearson, MD     GA/MEDQ  D:  09/17/2016  T:  09/18/2016  Job:  [id number]

## 2016-09-19 MED ORDER — IBUPROFEN 600 MG PO TABS: 600.0000 mg | ORAL_TABLET | Freq: Four times a day (QID) | ORAL | 0 refills | Status: DC | PRN

## 2016-09-19 MED ORDER — OXYCODONE-ACETAMINOPHEN 5-325 MG PO TABS: 1.0000 | ORAL_TABLET | ORAL | 0 refills | Status: DC | PRN

## 2016-09-19 NOTE — Progress Notes (Signed)
Pt is discharged in the care of husband, with N.T. Escort. Denies any pain or discomfort. Spirits are good. Understands all discharge instructions. Questions asked and answered. .Stable.

## 2016-09-20 LAB — TYPE AND SCREEN
ABO/RH(D): O POS
Antibody Screen: NEGATIVE
UNIT DIVISION: 0
Unit division: 0

## 2016-10-17 NOTE — Discharge Summary (Signed)
Physician Discharge Summary  Patient ID: Patricia Pineda MRN: [id number] DOB/AGE: [birth year] 50 y.o.  Admit date: 09/17/2016 Discharge date: 10/17/2016  Admission Diagnoses: fibroids, menorrhagia  Discharge Diagnoses:  Active Problems:   Menorrhagia   Discharged Condition: good  Hospital Course: Pt was admitted for post op care.  pain was controlled with IV pain medications and then as she was able to tolerate PO intake she was given PO pain meds.  Vitals and labs remained stable.  POD2, she was stable for discharge.  She was ambulating and voiding without problems.  Tolerating a regular diet and requesting discharge  Consults: None  Significant Diagnostic Studies: labs: cbc  Treatments: surgery: LAVH/left oophorectomy  Discharge Exam: Blood pressure (!) 130/54, pulse 80, temperature 99 F (37.2 C), temperature source Oral, resp. rate 18, height 5\' 6"  (1.676 m), weight 241 lb (109.3 kg), last menstrual period 08/17/2016, SpO2 100 %. General appearance: alert and cooperative GI: normal findings: soft, non-tender Incision/Wound:clean and intact  Disposition: 01-Home or Self Care     Medication List    STOP taking these medications   acetaminophen 500 MG tablet Commonly known as:  TYLENOL     TAKE these medications   amLODipine 5 MG tablet Commonly known as:  NORVASC TAKE 1 TABLET BY MOUTH DAILY   ibuprofen 600 MG tablet Commonly known as:  ADVIL,MOTRIN Take 1 tablet (600 mg total) by mouth every 6 (six) hours as needed for moderate pain.   IRON PO Take 1 tablet by mouth daily.   oxyCODONE-acetaminophen 5-325 MG tablet Commonly known as:  PERCOCET/ROXICET Take 1-2 tablets by mouth every 4 (four) hours as needed for severe pain (moderate to severe pain (when tolerating fluids)).   simvastatin 10 MG tablet Commonly known as:  ZOCOR TAKE 1 TABLET (10 MG TOTAL) BY MOUTH 3 (THREE) TIMES A WEEK.   VITAMIN D PO Take 1 tablet by mouth daily.      Follow-up  Information    Lopez Dentinger, MD. Schedule an appointment as soon as possible for a visit in 2 week(s).   Specialty:  Obstetrics and Gynecology Contact information: [address], [place] [address] [phone]           Signed: Marylynn Pearson 10/17/2016, 7:23 AM

## 2016-10-28 ENCOUNTER — Other Ambulatory Visit: Payer: Self-pay

## 2016-10-28 MED ORDER — SIMVASTATIN 10 MG PO TABS: ORAL_TABLET | ORAL | 1 refills | Status: DC

## 2016-11-04 ENCOUNTER — Encounter: Payer: Self-pay | Admitting: Family Medicine

## 2016-11-04 ENCOUNTER — Ambulatory Visit (INDEPENDENT_AMBULATORY_CARE_PROVIDER_SITE_OTHER): Payer: Managed Care, Other (non HMO) | Admitting: Family Medicine

## 2016-11-04 ENCOUNTER — Encounter: Payer: Managed Care, Other (non HMO) | Admitting: Family Medicine

## 2016-11-04 VITALS — BP 128/82 | HR 68 | Temp 98.2°F | Ht 64.0 in | Wt 232.2 lb

## 2016-11-04 DIAGNOSIS — I10 Essential (primary) hypertension: Secondary | ICD-10-CM

## 2016-11-04 DIAGNOSIS — Z0001 Encounter for general adult medical examination with abnormal findings: Secondary | ICD-10-CM | POA: Diagnosis not present

## 2016-11-04 DIAGNOSIS — Z8639 Personal history of other endocrine, nutritional and metabolic disease: Secondary | ICD-10-CM

## 2016-11-04 LAB — CBC WITH DIFFERENTIAL/PLATELET
BASOS PCT: 0.4 % (ref 0.0–3.0)
Basophils Absolute: 0 10*3/uL (ref 0.0–0.1)
EOS ABS: 0.2 10*3/uL (ref 0.0–0.7)
Eosinophils Relative: 2.8 % (ref 0.0–5.0)
HCT: 31.4 % — ABNORMAL LOW (ref 36.0–46.0)
HEMOGLOBIN: 9.5 g/dL — AB (ref 12.0–15.0)
Lymphocytes Relative: 40 % (ref 12.0–46.0)
Lymphs Abs: 2.6 10*3/uL (ref 0.7–4.0)
MCHC: 30.3 g/dL (ref 30.0–36.0)
MCV: 59.6 fl — ABNORMAL LOW (ref 78.0–100.0)
MONO ABS: 0.4 10*3/uL (ref 0.1–1.0)
Monocytes Relative: 6.9 % (ref 3.0–12.0)
NEUTROS ABS: 3.2 10*3/uL (ref 1.4–7.7)
Neutrophils Relative %: 49.9 % (ref 43.0–77.0)
RBC: 5.27 Mil/uL — ABNORMAL HIGH (ref 3.87–5.11)
RDW: 20.9 % — AB (ref 11.5–15.5)
WBC: 6.4 10*3/uL (ref 4.0–10.5)

## 2016-11-04 LAB — LIPID PANEL
CHOLESTEROL: 189 mg/dL (ref 0–200)
HDL: 45.7 mg/dL (ref >39.00)
LDL Cholesterol: 114 mg/dL — ABNORMAL HIGH (ref 0–99)
NonHDL: 143.12
Total CHOL/HDL Ratio: 4
Triglycerides: 145 mg/dL (ref 0.0–149.0)
VLDL: 29 mg/dL (ref 0.0–40.0)

## 2016-11-04 LAB — HEPATIC FUNCTION PANEL
ALBUMIN: 4.2 g/dL (ref 3.5–5.2)
ALT: 14 U/L (ref 0–35)
AST: 12 U/L (ref 0–37)
Alkaline Phosphatase: 64 U/L (ref 39–117)
Bilirubin, Direct: 0 mg/dL (ref 0.0–0.3)
Total Bilirubin: 0.3 mg/dL (ref 0.2–1.2)
Total Protein: 6.8 g/dL (ref 6.0–8.3)

## 2016-11-04 LAB — BASIC METABOLIC PANEL
BUN: 12 mg/dL (ref 6–23)
CO2: 27 mEq/L (ref 19–32)
Calcium: 9.5 mg/dL (ref 8.4–10.5)
Chloride: 103 mEq/L (ref 96–112)
Creatinine, Ser: 0.69 mg/dL (ref 0.40–1.20)
GFR: 115.89 mL/min (ref >60.00)
Glucose, Bld: 82 mg/dL (ref 70–99)
POTASSIUM: 4.2 meq/L (ref 3.5–5.1)
SODIUM: 140 meq/L (ref 135–145)

## 2016-11-04 LAB — POC URINALSYSI DIPSTICK (AUTOMATED)
BILIRUBIN UA: NEGATIVE
Glucose, UA: NEGATIVE
KETONES UA: NEGATIVE
Leukocytes, UA: NEGATIVE
Nitrite, UA: NEGATIVE
PH UA: 6.5
Spec Grav, UA: 1.02
Urobilinogen, UA: 0.2

## 2016-11-04 LAB — TSH: TSH: 1.7 u[IU]/mL (ref 0.35–4.50)

## 2016-11-04 MED ORDER — AMLODIPINE BESYLATE 5 MG PO TABS: 5.0000 mg | ORAL_TABLET | Freq: Every day | ORAL | 0 refills | Status: DC

## 2016-11-04 NOTE — Patient Instructions (Signed)
We have ordered labs or studies at this visit. It can take up to 1-2 weeks for results and processing. IF results require follow up or explanation, we will call you with instructions. Clinically stable results will be released to your [facility] If you have not heard from [place] or cannot find your results in [facility] in 2 weeks please contact our office at [phone].  If you are not yet signed up for [facility], please consider signing up   Health Maintenance, Female Introduction Adopting a healthy lifestyle and getting preventive care can go a long way to promote health and wellness. Talk with your health care provider about what schedule of regular examinations is right for you. This is a good chance for you to check in with your provider about disease prevention and staying healthy. In between checkups, there are plenty of things you can do on your own. Experts have done a lot of research about which lifestyle changes and preventive measures are most likely to keep you healthy. Ask your health care provider for more information. Weight and diet Eat a healthy diet  Be sure to include plenty of vegetables, fruits, low-fat dairy products, and lean protein.  Do not eat a lot of foods high in solid fats, added sugars, or salt.  Get regular exercise. This is one of the most important things you can do for your health.  Most adults should exercise for at least 150 minutes each week. The exercise should increase your heart rate and make you sweat (moderate-intensity exercise).  Most adults should also do strengthening exercises at least twice a week. This is in addition to the moderate-intensity exercise. Maintain a healthy weight  Body mass index (BMI) is a measurement that can be used to identify possible weight problems. It estimates body fat based on height and weight. Your health care provider can help determine your BMI and help you achieve or maintain a healthy weight.  For females 78 years of  age and older:  A BMI below 18.5 is considered underweight.  A BMI of 18.5 to 24.9 is normal.  A BMI of 25 to 29.9 is considered overweight.  A BMI of 30 and above is considered obese. Watch levels of cholesterol and blood lipids  You should start having your blood tested for lipids and cholesterol at 49 years of age, then have this test every 5 years.  You may need to have your cholesterol levels checked more often if:  Your lipid or cholesterol levels are high.  You are older than 49 years of age.  You are at high risk for heart disease. Cancer screening Lung Cancer  Lung cancer screening is recommended for adults 9-34 years old who are at high risk for lung cancer because of a history of smoking.  A yearly low-dose CT scan of the lungs is recommended for people who:  Currently smoke.  Have quit within the past 15 years.  Have at least a 30-pack-year history of smoking. A pack year is smoking an average of one pack of cigarettes a day for 1 year.  Yearly screening should continue until it has been 15 years since you quit.  Yearly screening should stop if you develop a health problem that would prevent you from having lung cancer treatment. Breast Cancer  Practice breast self-awareness. This means understanding how your breasts normally appear and feel.  It also means doing regular breast self-exams. Let your health care provider know about any changes, no matter how small.  If you are in your 20s or 30s, you should have a clinical breast exam (CBE) by a health care provider every 1-3 years as part of a regular health exam.  If you are 87 or older, have a CBE every year. Also consider having a breast X-ray (mammogram) every year.  If you have a family history of breast cancer, talk to your health care provider about genetic screening.  If you are at high risk for breast cancer, talk to your health care provider about having an MRI and a mammogram every  year.  Breast cancer gene (BRCA) assessment is recommended for women who have family members with BRCA-related cancers. BRCA-related cancers include:  Breast.  Ovarian.  Tubal.  Peritoneal cancers.  Results of the assessment will determine the need for genetic counseling and BRCA1 and BRCA2 testing. Cervical Cancer  Your health care provider may recommend that you be screened regularly for cancer of the pelvic organs (ovaries, uterus, and vagina). This screening involves a pelvic examination, including checking for microscopic changes to the surface of your cervix (Pap test). You may be encouraged to have this screening done every 3 years, beginning at age 20.  For women ages 55-65, health care providers may recommend pelvic exams and Pap testing every 3 years, or they may recommend the Pap and pelvic exam, combined with testing for human papilloma virus (HPV), every 5 years. Some types of HPV increase your risk of cervical cancer. Testing for HPV may also be done on women of any age with unclear Pap test results.  Other health care providers may not recommend any screening for nonpregnant women who are considered low risk for pelvic cancer and who do not have symptoms. Ask your health care provider if a screening pelvic exam is right for you.  If you have had past treatment for cervical cancer or a condition that could lead to cancer, you need Pap tests and screening for cancer for at least 20 years after your treatment. If Pap tests have been discontinued, your risk factors (such as having a new sexual partner) need to be reassessed to determine if screening should resume. Some women have medical problems that increase the chance of getting cervical cancer. In these cases, your health care provider may recommend more frequent screening and Pap tests. Colorectal Cancer  This type of cancer can be detected and often prevented.  Routine colorectal cancer screening usually begins at 49 years  of age and continues through 49 years of age.  Your health care provider may recommend screening at an earlier age if you have risk factors for colon cancer.  Your health care provider may also recommend using home test kits to check for hidden blood in the stool.  A small camera at the end of a tube can be used to examine your colon directly (sigmoidoscopy or colonoscopy). This is done to check for the earliest forms of colorectal cancer.  Routine screening usually begins at age 1.  Direct examination of the colon should be repeated every 5-10 years through 49 years of age. However, you may need to be screened more often if early forms of precancerous polyps or small growths are found. Skin Cancer  Check your skin from head to toe regularly.  Tell your health care provider about any new moles or changes in moles, especially if there is a change in a mole's shape or color.  Also tell your health care provider if you have a mole that is larger than  the size of a pencil eraser.  Always use sunscreen. Apply sunscreen liberally and repeatedly throughout the day.  Protect yourself by wearing long sleeves, pants, a wide-brimmed hat, and sunglasses whenever you are outside. Heart disease, diabetes, and high blood pressure  High blood pressure causes heart disease and increases the risk of stroke. High blood pressure is more likely to develop in:  People who have blood pressure in the high end of the normal range (130-139/85-89 mm Hg).  People who are overweight or obese.  People who are African American.  If you are 91-64 years of age, have your blood pressure checked every 3-5 years. If you are 53 years of age or older, have your blood pressure checked every year. You should have your blood pressure measured twice-once when you are at a hospital or clinic, and once when you are not at a hospital or clinic. Record the average of the two measurements. To check your blood pressure when you  are not at a hospital or clinic, you can use:  An automated blood pressure machine at a pharmacy.  A home blood pressure monitor.  If you are between 13 years and 68 years old, ask your health care provider if you should take aspirin to prevent strokes.  Have regular diabetes screenings. This involves taking a blood sample to check your fasting blood sugar level.  If you are at a normal weight and have a low risk for diabetes, have this test once every three years after 49 years of age.  If you are overweight and have a high risk for diabetes, consider being tested at a younger age or more often. Preventing infection Hepatitis B  If you have a higher risk for hepatitis B, you should be screened for this virus. You are considered at high risk for hepatitis B if:  You were born in a country where hepatitis B is common. Ask your health care provider which countries are considered high risk.  Your parents were born in a high-risk country, and you have not been immunized against hepatitis B (hepatitis B vaccine).  You have HIV or AIDS.  You use needles to inject street drugs.  You live with someone who has hepatitis B.  You have had sex with someone who has hepatitis B.  You get hemodialysis treatment.  You take certain medicines for conditions, including cancer, organ transplantation, and autoimmune conditions. Hepatitis C  Blood testing is recommended for:  Everyone born from [birth year] through 1964.  Anyone with known risk factors for hepatitis C. Sexually transmitted infections (STIs)  You should be screened for sexually transmitted infections (STIs) including gonorrhea and chlamydia if:  You are sexually active and are younger than 49 years of age.  You are older than 49 years of age and your health care provider tells you that you are at risk for this type of infection.  Your sexual activity has changed since you were last screened and you are at an increased risk for  chlamydia or gonorrhea. Ask your health care provider if you are at risk.  If you do not have HIV, but are at risk, it may be recommended that you take a prescription medicine daily to prevent HIV infection. This is called pre-exposure prophylaxis (PrEP). You are considered at risk if:  You are sexually active and do not regularly use condoms or know the HIV status of your partner(s).  You take drugs by injection.  You are sexually active with a partner who has  HIV. Talk with your health care provider about whether you are at high risk of being infected with HIV. If you choose to begin PrEP, you should first be tested for HIV. You should then be tested every 3 months for as long as you are taking PrEP. Pregnancy  If you are premenopausal and you may become pregnant, ask your health care provider about preconception counseling.  If you may become pregnant, take 400 to 800 micrograms (mcg) of folic acid every day.  If you want to prevent pregnancy, talk to your health care provider about birth control (contraception). Osteoporosis and menopause  Osteoporosis is a disease in which the bones lose minerals and strength with aging. This can result in serious bone fractures. Your risk for osteoporosis can be identified using a bone density scan.  If you are 62 years of age or older, or if you are at risk for osteoporosis and fractures, ask your health care provider if you should be screened.  Ask your health care provider whether you should take a calcium or vitamin D supplement to lower your risk for osteoporosis.  Menopause may have certain physical symptoms and risks.  Hormone replacement therapy may reduce some of these symptoms and risks. Talk to your health care provider about whether hormone replacement therapy is right for you. Follow these instructions at home:  Schedule regular health, dental, and eye exams.  Stay current with your immunizations.  Do not use any tobacco products  including cigarettes, chewing tobacco, or electronic cigarettes.  If you are pregnant, do not drink alcohol.  If you are breastfeeding, limit how much and how often you drink alcohol.  Limit alcohol intake to no more than 1 drink per day for nonpregnant women. One drink equals 12 ounces of beer, 5 ounces of wine, or 1 ounces of hard liquor.  Do not use street drugs.  Do not share needles.  Ask your health care provider for help if you need support or information about quitting drugs.  Tell your health care provider if you often feel depressed.  Tell your health care provider if you have ever been abused or do not feel safe at home. This information is not intended to replace advice given to you by your health care provider. Make sure you discuss any questions you have with your health care provider. Document Released: 05/20/2011 Document Revised: 04/11/2016 Document Reviewed: 08/08/2015  2017 [employer]

## 2016-11-04 NOTE — Progress Notes (Signed)
Pre visit review using our clinic review tool, if applicable. No additional management support is needed unless otherwise documented below in the visit note. 

## 2016-11-04 NOTE — Progress Notes (Signed)
Subjective:    Patient ID: Patricia Pineda, female    DOB: Jul 19, 1966, 50 y.o.   MRN: [id number]  HPI  Patricia Pineda is here today for routine physical examination.  Hysterectomy with salpingectomy in 09/17/16 where she is followed by Dr. Marylynn Pearson. She reports feeling well and recovery has been excellent.  Simvastatin three times/week and she denies adverse effects; no myalgias. She has been out of this medication for one month and is here today for evaluation and refills   HTN:  She denies monitoring her BP. She denies chest pain, palpitations, SOB, numbness, tingling, weakness, or edema  Exercise is limited and she is interested in increasing this and exercising with son which will include walking and use of weights. She denies cardiopulmonary symptoms with exercise.  Modified heart healthy diet   Multivitamin recommended by her gynecologist.   Review of Systems  Constitutional: No fever, chills, significant weight change, fatigue, weakness or night sweats Eyes: No redness, discharge, pain, blurred vision, double vision, or loss of vision ENT/mouth: No nasal congestion, postnasal drainage,epistaxis, purulent discharge, earache, hearing loss, tinnitus ,sore throat , dental pain, or hoarseness   Cardiovascular: no chest pain, palpitations, racing, irregular rhythm, syncope, nausea, sweating, claudication, or edema  Respiratory: No cough, sputum production,hemoptysis,  dyspnea, paroxysmal nocturnal dyspnea, pleuritic chest pain, significant snoring, or  apnea    Gastrointestinal: No heartburn,dysphagia, nausea and vomiting,ominal pain, change in bowels, anorexia, diarrhea, significant constipation, rectal bleeding, melena,  stool incontinence or jaundice Genitourinary: No dysuria,hematuria, pyuria, frequency, urgency,  incontinence, nocturia, dark urine or flank pain Musculoskeletal: No myalgias or muscle cramping, joint stiffness, joint swelling, joint color change, weakness, or  cyanosis Dermatologic: No rash, pruritus, urticaria, or change in color or temperature of skin Neurologic: No headache, vertigo, limb weakness, tremor, gait disturbance, seizures, memory loss, numbness or tingling Psychiatric: No significant anxiety or depression, anhedonia, panic attacks, insomnia, or anorexia Endocrine: No change in hair/skin/ nails, excessive thirst, excessive hunger, excessive urination, or unexplained fatigue Hematologic/lymphatic: No bruising, lymphadenopathy,or  abnormal clotting Allergy/immunology: No itchy/ watery eyes, abnormal sneezing, rhinitis, urticaria ,or angioedema     Past Medical History:  Diagnosis Date  . Anemia   . Arthritis    knees  . High cholesterol   . Hypertension   . Pneumonia      Social History   Social History  . Marital status: Married    Spouse name: N/A  . Number of children: N/A  . Years of education: N/A   Occupational History  . mental health supervisor    Social History Main Topics  . Smoking status: Never Smoker  . Smokeless tobacco: Never Used  . Alcohol use 0.6 oz/week    1 Glasses of wine per week     Comment: occassionally  . Drug use: No  . Sexual activity: Yes    Partners: Female   Other Topics Concern  . Not on file   Social History Narrative   90 year old daughter and 19 year old son    Past Surgical History:  Procedure Laterality Date  . ABDOMINAL HYSTERECTOMY  09/17/2016  . CESAREAN SECTION    . HYSTERECTOMY ABDOMINAL WITH SALPINGECTOMY Bilateral 09/17/2016   Procedure: HYSTERECTOMY ABDOMINAL WITH BILATERAL SALPINGECTOMY, LEFT OOPHORECTOMY;  Surgeon: Marylynn Pearson, MD;  Location: [facility];  Service: Gynecology;  Laterality: Bilateral;    Family History  Problem Relation Age of Onset  . Hypertension Father   . Prostate cancer Father   . Heart attack Maternal  Grandmother     early 38s  . Arthritis Paternal Grandmother     No Known Allergies  Current Outpatient Prescriptions on File Prior  to Visit  Medication Sig Dispense Refill  . Cholecalciferol (VITAMIN D PO) Take 1 tablet by mouth daily.    Marland Kitchen ibuprofen (ADVIL,MOTRIN) 600 MG tablet Take 1 tablet (600 mg total) by mouth every 6 (six) hours as needed for moderate pain. 30 tablet 0  . IRON PO Take 1 tablet by mouth daily.    . simvastatin (ZOCOR) 10 MG tablet TAKE 1 TABLET (10 MG TOTAL) BY MOUTH 3 (THREE) TIMES A WEEK. 15 tablet 1  . oxyCODONE-acetaminophen (PERCOCET/ROXICET) 5-325 MG tablet Take 1-2 tablets by mouth every 4 (four) hours as needed for severe pain (moderate to severe pain (when tolerating fluids)). (Patient not taking: Reported on 11/04/2016) 30 tablet 0   No current facility-administered medications on file prior to visit.     BP 128/82 (BP Location: Left Arm, Patient Position: Sitting, Cuff Size: Large)   Pulse 68   Temp 98.2 F (36.8 C) (Oral)   Ht 5\' 4"  (1.626 m)   Wt 232 lb 3.2 oz (105.3 kg)   SpO2 95%   BMI 39.86 kg/m    Objective:   Physical Exam  Physical Exam  Constitutional: She is oriented to person, place, and time. She appears well-developed and well-nourished. No distress.  HENT:  Head: Normocephalic and atraumatic.  Right Ear: Tympanic membrane and ear canal normal.  Left Ear: Tympanic membrane and ear canal normal.  Mouth/Throat: Oropharynx is clear and moist.  Eyes: Pupils are equal, round, and reactive to light. No scleral icterus.  Neck: Normal range of motion. No thyromegaly present.  Cardiovascular: Normal rate and regular rhythm.   No murmur heard. Pulmonary/Chest: Effort normal and breath sounds normal. No respiratory distress. She has no wheezes. She has no rales. She exhibits no tenderness.  Abdominal: Soft. Bowel sounds are normal. She exhibits no distension and no mass. There is no tenderness. There is no rebound and no guarding.  Musculoskeletal: She exhibits no edema.  Lymphadenopathy:    She has no cervical adenopathy.  Neurological: She is alert and oriented to  person, place, and time. She has normal patellar reflexes. She exhibits normal muscle tone. Coordination normal.  Skin: Skin is warm and dry.  Psychiatric: She has a normal mood and affect. Her behavior is normal. Judgment and thought content normal.  Breast and pelvic exam deferred: She is followed by gynecology and is UTD       Assessment & Plan:  1. Encounter for general adult medical examination with abnormal findings 50 y.o. female presenting for annual physical.  Health Maintenance counseling: 1. Anticipatory guidance: Patient counseled regarding regular dental exams, eye exams, wearing seatbelts.  2. Risk factor reduction:  Advised patient of need for regular exercise and diet rich and fruits and vegetables to reduce risk of heart attack and stroke.  3. Immunizations/screenings/ancillary studies Immunization History  Administered Date(s) Administered  . Influenza Whole 12/15/2012, 09/21/2013  . Influenza-Unspecified 10/03/2016  . Tdap 03/18/2012    4. Cervical cancer screening- Hysterectomy 10/17; she is followed by gynecology. 5. Breast cancer screening-  She is followed by gynecology 6. Colon cancer screening - Not needed   - CBC with Differential/Platelet - Basic metabolic panel - Lipid panel - Hepatic function panel - TSH  2. Essential hypertension Controlled; continue same dose - amLODipine (NORVASC) 5 MG tablet; Take 1 tablet (5 mg total) by mouth  daily.  Dispense: 90 tablet; Refill: 0  3. History of hyperlipidemia Will check lipid level today; Advised her to continue prior regimen and lab results will determine if changes are needed.  Follow up in one year or sooner for acute concerns if needed.  Delano Metz, FNP-C

## 2016-11-05 ENCOUNTER — Other Ambulatory Visit: Payer: Self-pay | Admitting: Family Medicine

## 2016-11-05 DIAGNOSIS — D649 Anemia, unspecified: Secondary | ICD-10-CM

## 2016-11-05 NOTE — Progress Notes (Signed)
Anemia is improving. Continue iron supplement and recheck CBC in one month.

## 2016-11-28 ENCOUNTER — Encounter: Payer: Self-pay | Admitting: *Deleted

## 2016-11-28 ENCOUNTER — Other Ambulatory Visit: Payer: Self-pay | Admitting: *Deleted

## 2016-11-28 NOTE — Patient Outreach (Signed)
[facility]) Care Management  11/28/2016  CEILIDH LUKER 07/21/1966 [id number]   Subjective: Telephone call to patient's home /mobile number, spoke with patient, and HIPAA verified.   Discussed [facility] Management [employer] Transition of care follow up, patient voices understanding, and is in agreement to complete follow up.   Patients she is recovering well from the surgery, currently has a cold, cold being treated with over the counter medication.   RNCM educated patient to continuing to drink plenty of fluids, monitor symptom changes, follow up with primary MD as needed, patient voices understanding, and is in agreement.   Patient has attended hospital follow up appointments with surgeon,  primary MD, and return to work on  11/13/16.   Patient states she does not have any transition of care, care coordination, disease management, disease monitoring, transportation, community resource, or pharmacy needs at this time.   States she is very appreciative of follow up call and is in agreement to receive [facility] information.   Objective: Per chart review: patient hospitalized  09/17/16 - 09/19/16 for Menorrhagia.   Status post Total abdominal hysterectomy, bilateral salpingectomy, and left oophorectomy on 10/31/7.    Assessment: Received [employer] Transition of care referral on 11/05/16.    Transition of care follow up completed, no care management needs, and will proceed with case closure.  Plan:  RNCM will send patient successful outreach letter, [facility] pamphlet, and magnet. RNCM will send case closure due to follow up completed / no care management needs request to [facility].   Jamarria Real H. Annia Friendly, BSN, [facility] Telephonic CM Phone: [phone] Fax: [phone]

## 2017-03-07 ENCOUNTER — Other Ambulatory Visit: Payer: Self-pay | Admitting: Family Medicine

## 2017-03-07 DIAGNOSIS — I10 Essential (primary) hypertension: Secondary | ICD-10-CM

## 2017-03-24 ENCOUNTER — Other Ambulatory Visit: Payer: Managed Care, Other (non HMO)

## 2017-03-25 ENCOUNTER — Other Ambulatory Visit (INDEPENDENT_AMBULATORY_CARE_PROVIDER_SITE_OTHER): Payer: Managed Care, Other (non HMO)

## 2017-03-25 DIAGNOSIS — D649 Anemia, unspecified: Secondary | ICD-10-CM | POA: Diagnosis not present

## 2017-03-25 LAB — CBC WITH DIFFERENTIAL/PLATELET
BASOS ABS: 0 10*3/uL (ref 0.0–0.1)
Basophils Relative: 0.5 % (ref 0.0–3.0)
EOS ABS: 0.2 10*3/uL (ref 0.0–0.7)
Eosinophils Relative: 1.9 % (ref 0.0–5.0)
HCT: 39.4 % (ref 36.0–46.0)
Hemoglobin: 13 g/dL (ref 12.0–15.0)
LYMPHS PCT: 37.9 % (ref 12.0–46.0)
Lymphs Abs: 3.1 10*3/uL (ref 0.7–4.0)
MCHC: 32.9 g/dL (ref 30.0–36.0)
MCV: 72.1 fl — ABNORMAL LOW (ref 78.0–100.0)
Monocytes Absolute: 0.6 10*3/uL (ref 0.1–1.0)
Monocytes Relative: 7.2 % (ref 3.0–12.0)
NEUTROS ABS: 4.3 10*3/uL (ref 1.4–7.7)
Neutrophils Relative %: 52.5 % (ref 43.0–77.0)
PLATELETS: 456 10*3/uL — AB (ref 150.0–400.0)
RBC: 5.47 Mil/uL — ABNORMAL HIGH (ref 3.87–5.11)
RDW: 20.6 % — ABNORMAL HIGH (ref 11.5–15.5)
WBC: 8.2 10*3/uL (ref 4.0–10.5)

## 2017-03-27 ENCOUNTER — Other Ambulatory Visit: Payer: Self-pay

## 2017-03-27 DIAGNOSIS — Z1211 Encounter for screening for malignant neoplasm of colon: Secondary | ICD-10-CM

## 2017-04-29 ENCOUNTER — Encounter: Payer: Self-pay | Admitting: Internal Medicine

## 2017-06-13 ENCOUNTER — Other Ambulatory Visit: Payer: Self-pay | Admitting: Family

## 2017-07-03 ENCOUNTER — Encounter: Payer: Managed Care, Other (non HMO) | Admitting: Internal Medicine

## 2017-07-22 ENCOUNTER — Other Ambulatory Visit: Payer: Self-pay | Admitting: Family Medicine

## 2017-07-22 DIAGNOSIS — I10 Essential (primary) hypertension: Secondary | ICD-10-CM

## 2017-07-24 ENCOUNTER — Encounter: Payer: Self-pay | Admitting: Family Medicine

## 2017-09-16 ENCOUNTER — Telehealth: Payer: Self-pay | Admitting: Family Medicine

## 2017-09-16 DIAGNOSIS — I10 Essential (primary) hypertension: Secondary | ICD-10-CM

## 2017-09-18 ENCOUNTER — Other Ambulatory Visit: Payer: Self-pay

## 2017-09-18 DIAGNOSIS — I10 Essential (primary) hypertension: Secondary | ICD-10-CM

## 2017-09-18 MED ORDER — AMLODIPINE BESYLATE 5 MG PO TABS: 5.0000 mg | ORAL_TABLET | Freq: Every day | ORAL | 0 refills | Status: DC

## 2017-09-18 MED ORDER — SIMVASTATIN 10 MG PO TABS: ORAL_TABLET | ORAL | 0 refills | Status: DC

## 2017-09-18 NOTE — Telephone Encounter (Signed)
Pt request refill  amLODipine (NORVASC) 5 MG tablet  simvastatin (ZOCOR) 10 MG tablet  Pt has scheduled an appt with Dr Volanda Napoleon on 11/28, but needs a 30 day to get her through until then. Can you send to  Ammie Ferrier [address] [place] [address]

## 2017-09-18 NOTE — Telephone Encounter (Signed)
Pt requested for refills before her appointment to transfer to dr Volanda Napoleon. Pt Rx for amlodipine and Zocor have been sent to her phamacy, enough was given to get her through to her appointment.

## 2017-10-15 ENCOUNTER — Encounter: Payer: Self-pay | Admitting: Family Medicine

## 2017-10-15 ENCOUNTER — Ambulatory Visit (INDEPENDENT_AMBULATORY_CARE_PROVIDER_SITE_OTHER): Payer: Managed Care, Other (non HMO) | Admitting: Family Medicine

## 2017-10-15 VITALS — BP 130/82 | HR 81 | Temp 98.4°F | Ht 65.0 in | Wt 236.6 lb

## 2017-10-15 DIAGNOSIS — Z1329 Encounter for screening for other suspected endocrine disorder: Secondary | ICD-10-CM | POA: Diagnosis not present

## 2017-10-15 DIAGNOSIS — E785 Hyperlipidemia, unspecified: Secondary | ICD-10-CM

## 2017-10-15 DIAGNOSIS — L83 Acanthosis nigricans: Secondary | ICD-10-CM | POA: Diagnosis not present

## 2017-10-15 DIAGNOSIS — I10 Essential (primary) hypertension: Secondary | ICD-10-CM | POA: Diagnosis not present

## 2017-10-15 DIAGNOSIS — Z862 Personal history of diseases of the blood and blood-forming organs and certain disorders involving the immune mechanism: Secondary | ICD-10-CM

## 2017-10-15 LAB — LIPID PANEL
CHOLESTEROL: 218 mg/dL — AB (ref 0–200)
HDL: 46 mg/dL (ref >39.00)
LDL Cholesterol: 148 mg/dL — ABNORMAL HIGH (ref 0–99)
NonHDL: 171.51
TRIGLYCERIDES: 119 mg/dL (ref 0.0–149.0)
Total CHOL/HDL Ratio: 5
VLDL: 23.8 mg/dL (ref 0.0–40.0)

## 2017-10-15 LAB — COMPREHENSIVE METABOLIC PANEL
ALK PHOS: 70 U/L (ref 39–117)
ALT: 15 U/L (ref 0–35)
AST: 12 U/L (ref 0–37)
Albumin: 4.1 g/dL (ref 3.5–5.2)
BILIRUBIN TOTAL: 0.4 mg/dL (ref 0.2–1.2)
BUN: 14 mg/dL (ref 6–23)
CALCIUM: 9.4 mg/dL (ref 8.4–10.5)
CHLORIDE: 104 meq/L (ref 96–112)
CO2: 28 meq/L (ref 19–32)
Creatinine, Ser: 0.74 mg/dL (ref 0.40–1.20)
GFR: 106.5 mL/min (ref >60.00)
Glucose, Bld: 93 mg/dL (ref 70–99)
POTASSIUM: 4 meq/L (ref 3.5–5.1)
Sodium: 139 mEq/L (ref 135–145)
Total Protein: 6.9 g/dL (ref 6.0–8.3)

## 2017-10-15 LAB — T4, FREE: Free T4: 0.71 ng/dL (ref 0.60–1.60)

## 2017-10-15 LAB — CBC WITH DIFFERENTIAL/PLATELET
BASOS PCT: 0.4 % (ref 0.0–3.0)
Basophils Absolute: 0 10*3/uL (ref 0.0–0.1)
Eosinophils Absolute: 0.1 10*3/uL (ref 0.0–0.7)
Eosinophils Relative: 1.9 % (ref 0.0–5.0)
HCT: 42.1 % (ref 36.0–46.0)
Hemoglobin: 14.3 g/dL (ref 12.0–15.0)
LYMPHS ABS: 2.5 10*3/uL (ref 0.7–4.0)
Lymphocytes Relative: 35.5 % (ref 12.0–46.0)
MCHC: 33.9 g/dL (ref 30.0–36.0)
MCV: 81.1 fl (ref 78.0–100.0)
MONOS PCT: 6.3 % (ref 3.0–12.0)
Monocytes Absolute: 0.4 10*3/uL (ref 0.1–1.0)
NEUTROS ABS: 3.9 10*3/uL (ref 1.4–7.7)
NEUTROS PCT: 55.9 % (ref 43.0–77.0)
PLATELETS: 434 10*3/uL — AB (ref 150.0–400.0)
RBC: 5.19 Mil/uL — ABNORMAL HIGH (ref 3.87–5.11)
RDW: 14.7 % (ref 11.5–15.5)
WBC: 6.9 10*3/uL (ref 4.0–10.5)

## 2017-10-15 LAB — TSH: TSH: 1.95 u[IU]/mL (ref 0.35–4.50)

## 2017-10-15 LAB — HEMOGLOBIN A1C: Hgb A1c MFr Bld: 5.8 % (ref 4.6–6.5)

## 2017-10-15 MED ORDER — AMLODIPINE BESYLATE 5 MG PO TABS: 5.0000 mg | ORAL_TABLET | Freq: Every day | ORAL | 3 refills | Status: DC

## 2017-10-15 MED ORDER — SIMVASTATIN 10 MG PO TABS: ORAL_TABLET | ORAL | 6 refills | Status: DC

## 2017-10-15 NOTE — Progress Notes (Signed)
Subjective:    Patient ID: Patricia Pineda, female    DOB: Apr 03, 1966, 50 y.o.   MRN: [id number]  No chief complaint on file.   HPI Patient was seen today for f/u on HTN and HLD.  Pt states she has been doing well.  She has been compliant with Norvasc 5 mg daily.  She does not check bp at home, but could check it at work.   Pt has also been compliant with Zocor 10 mg 3 x/wk.  In the past, pt was on other statins, but had S/Es.  Denies myalgias. She has been trying to lose weight by eating better and drinking more water.  Pt has a h/o anemia.  Has not had hgb checked in a while.  Not taking iron, as was feeling ok.  P Surg hx:  Hysterectomy in 2017 2/2 fibroids and cyst on L ovary.  R ovary remains.  Followed by OB/GYN for Paps.  Patient endorses a family history of DM and some thyroid issues.  Past Medical History:  Diagnosis Date  . Anemia   . Arthritis    knees  . High cholesterol   . Hypertension   . Pneumonia     No Known Allergies  ROS General: Denies fever, chills, night sweats, changes in weight, changes in appetite HEENT: Denies headaches, ear pain, changes in vision, rhinorrhea, sore throat CV: Denies CP, palpitations, SOB, orthopnea Pulm: Denies SOB, cough, wheezing GI: Denies abdominal pain, nausea, vomiting, diarrhea, constipation GU: Denies dysuria, hematuria, frequency, vaginal discharge Msk: Denies muscle cramps, joint pains Neuro: Denies weakness, numbness, tingling Skin: Denies rashes, bruising Psych: Denies depression, anxiety, hallucinations     Objective:    Blood pressure 130/82, pulse 81, temperature 98.4 F (36.9 C), temperature source Oral, height 5\' 5"  (1.651 m), weight 236 lb 9.6 oz (107.3 kg), last menstrual period 08/17/2016.   Gen. Pleasant, well-nourished, in no distress, normal affect  HEENT: [place]/AT, face symmetric,no scleral icterus, PERRLA, nares patent without drainage, pharynx without erythema or exudate. Neck: No JVD, no thyromegaly,  no carotid bruits Lungs: no accessory muscle use, CTAB, no wheezes or rales Cardiovascular: RRR, no m/r/g, no peripheral edema Abdomen: BS present, soft, NT/ND. Musculoskeletal: No deformities, no cyanosis or clubbing, normal tone Neuro:  A&Ox3, CN II-XII intact, normal gait Skin:  Warm, no lesions/ rash.  Acanthosis nigricans noted on lateral sides of neck. A few small skin tags also noted on neck.   Wt Readings from Last 3 Encounters:  10/15/17 236 lb 9.6 oz (107.3 kg)  11/04/16 232 lb 3.2 oz (105.3 kg)  09/17/16 241 lb (109.3 kg)    Lab Results  Component Value Date   WBC 8.2 03/25/2017   HGB 13.0 03/25/2017   HCT 39.4 03/25/2017   PLT 456.0 (H) 03/25/2017   GLUCOSE 82 11/04/2016   CHOL 189 11/04/2016   TRIG 145.0 11/04/2016   HDL 45.70 11/04/2016   LDLDIRECT 147.0 05/11/2013   LDLCALC 114 (H) 11/04/2016   ALT 14 11/04/2016   AST 12 11/04/2016   NA 140 11/04/2016   K 4.2 11/04/2016   CL 103 11/04/2016   CREATININE 0.69 11/04/2016   BUN 12 11/04/2016   CO2 27 11/04/2016   TSH 1.70 11/04/2016    Assessment/Plan:  Essential hypertension -continue Norvasc 5 mg daily -Pt encouraged to check bp at work. -Pt also encouraged to increase physical activity and po intake of water.  - Plan: Comprehensive metabolic panel, amLODipine (NORVASC) 5 MG tablet  Acanthosis nigricans  -  Wt loss encouraged. - Plan: Hemoglobin A1c  History of anemia  - Plan: CBC with Differential/Platelet  Hyperlipidemia, unspecified hyperlipidemia type  -continue Zocor 10 mg three times per wk.  Has myalgias in the past with other statins. -Lifestyle modifications encouraged. - Plan: Lipid panel  Screening for thyroid disorder  - Plan: TSH, T4, free  F/u in next few months for CPE.  Pt given handout to schedule mammogram.

## 2017-10-15 NOTE — Patient Instructions (Addendum)
Acanthosis Nigricans Acanthosis nigricans is a disorder in which dark, velvety markings appear on the skin. What are the causes? This condition may be caused by:  A hormonal or glandular disorder, such as diabetes.  Obesity.  Certain medicines, such as birth control pills.  A tumor. (This is rare.)  Some people inherit the condition from their parents. What increases the risk? This condition is more likely to develop in:  People who have a hormonal or glandular disorder.  People who are overweight.  People who take certain medicines.  People who have certain cancers, especially stomach cancer.  People who have dark-colored skin (dark complexion).  What are the signs or symptoms? The main symptom of this condition is velvety markings on the skin that are light brown, black, or grayish in color. The markings usually appear on the face, neck, armpits, inner thighs, and groin. In severe cases, markings may also appear on the lips, hands, breasts, eyelids, and mouth. How is this diagnosed? This condition may be diagnosed based on symptoms. Sometimes, a skin sample is taken for testing (skin biopsy). You may also have tests to help determine the cause of the condition. How is this treated? Treatment for this condition depends on the cause. Treatment may involve reducing insulin levels, which are often high in people who have this condition. Insulin levels can be reduced with:  Dietary changes, such as avoiding starchy foods and sugars.  Losing weight.  Medicines.  Sometimes, treatment involves:  Medicines to improve the appearance of the skin.  Laser treatment to improve the appearance of the skin.  Surgical removal of the skin markings (dermabrasion).  Follow these instructions at home:  Follow diet instructions from your health care provider.  Lose weight if you are overweight.  Take over-the-counter and prescription medicines only as told by your health care  provider.  Keep all follow-up visits as told by your health care provider. This is important. Contact a health care provider if:  The skin markings do not go away with treatment.  New skin markings develop on a part of the body where they rarely develop, such as on your lips, hands, breasts, eyelids, or mouth.  The condition recurs for an unknown reason. This information is not intended to replace advice given to you by your health care provider. Make sure you discuss any questions you have with your health care provider. Document Released: 11/04/2005 Document Revised: 04/11/2016 Document Reviewed: 12/29/2014 Elsevier Interactive Patient Education  2018 [place] Eating Plan DASH stands for "Dietary Approaches to Stop Hypertension." The DASH eating plan is a healthy eating plan that has been shown to reduce high blood pressure (hypertension). It may also reduce your risk for type 2 diabetes, heart disease, and stroke. The DASH eating plan may also help with weight loss. What are tips for following this plan? General guidelines  Avoid eating more than 2,300 mg (milligrams) of salt (sodium) a day. If you have hypertension, you may need to reduce your sodium intake to 1,500 mg a day.  Limit alcohol intake to no more than 1 drink a day for nonpregnant women and 2 drinks a day for men. One drink equals 12 oz of beer, 5 oz of wine, or 1 oz of hard liquor.  Work with your health care provider to maintain a healthy body weight or to lose weight. Ask what an ideal weight is for you.  Get at least 30 minutes of exercise that causes your heart to beat faster (  aerobic exercise) most days of the week. Activities may include walking, swimming, or biking.  Work with your health care provider or diet and nutrition specialist (dietitian) to adjust your eating plan to your individual calorie needs. Reading food labels  Check food labels for the amount of sodium per serving. Choose foods with  less than 5 percent of the Daily Value of sodium. Generally, foods with less than 300 mg of sodium per serving fit into this eating plan.  To find whole grains, look for the word "whole" as the first word in the ingredient list. Shopping  Buy products labeled as "low-sodium" or "no salt added."  Buy fresh foods. Avoid canned foods and premade or frozen meals. Cooking  Avoid adding salt when cooking. Use salt-free seasonings or herbs instead of table salt or sea salt. Check with your health care provider or pharmacist before using salt substitutes.  Do not fry foods. Cook foods using healthy methods such as baking, boiling, grilling, and broiling instead.  Cook with heart-healthy oils, such as olive, canola, soybean, or sunflower oil. Meal planning   Eat a balanced diet that includes: ? 5 or more servings of fruits and vegetables each day. At each meal, try to fill half of your plate with fruits and vegetables. ? Up to 6-8 servings of whole grains each day. ? Less than 6 oz of lean meat, poultry, or fish each day. A 3-oz serving of meat is about the same size as a deck of cards. One egg equals 1 oz. ? 2 servings of low-fat dairy each day. ? A serving of nuts, seeds, or beans 5 times each week. ? Heart-healthy fats. Healthy fats called Omega-3 fatty acids are found in foods such as flaxseeds and coldwater fish, like sardines, salmon, and mackerel.  Limit how much you eat of the following: ? Canned or prepackaged foods. ? Food that is high in trans fat, such as fried foods. ? Food that is high in saturated fat, such as fatty meat. ? Sweets, desserts, sugary drinks, and other foods with added sugar. ? Full-fat dairy products.  Do not salt foods before eating.  Try to eat at least 2 vegetarian meals each week.  Eat more home-cooked food and less restaurant, buffet, and fast food.  When eating at a restaurant, ask that your food be prepared with less salt or no salt, if  possible. What foods are recommended? The items listed may not be a complete list. Talk with your dietitian about what dietary choices are best for you. Grains Whole-grain or whole-wheat bread. Whole-grain or whole-wheat pasta. Brown rice. [place]. Bulgur. Whole-grain and low-sodium cereals. Pita bread. Low-fat, low-sodium crackers. Whole-wheat flour tortillas. Vegetables Fresh or frozen vegetables (raw, steamed, roasted, or grilled). Low-sodium or reduced-sodium tomato and vegetable juice. Low-sodium or reduced-sodium tomato sauce and tomato paste. Low-sodium or reduced-sodium canned vegetables. Fruits All fresh, dried, or frozen fruit. Canned fruit in natural juice (without added sugar). Meat and other protein foods Skinless chicken or [place]. Ground chicken or [place]. Pork with fat trimmed off. Fish and seafood. Egg whites. Dried beans, peas, or lentils. Unsalted nuts, nut butters, and seeds. Unsalted canned beans. Lean cuts of beef with fat trimmed off. Low-sodium, lean deli meat. Dairy Low-fat (1%) or fat-free (skim) milk. Fat-free, low-fat, or reduced-fat cheeses. Nonfat, low-sodium ricotta or cottage cheese. Low-fat or nonfat yogurt. Low-fat, low-sodium cheese. Fats and oils Soft margarine without trans fats. Vegetable oil. Low-fat, reduced-fat, or light mayonnaise and salad dressings (reduced-sodium). Canola,  safflower, olive, soybean, and sunflower oils. Avocado. Seasoning and other foods Herbs. Spices. Seasoning mixes without salt. Unsalted popcorn and pretzels. Fat-free sweets. What foods are not recommended? The items listed may not be a complete list. Talk with your dietitian about what dietary choices are best for you. Grains Baked goods made with fat, such as croissants, muffins, or some breads. Dry pasta or rice meal packs. Vegetables Creamed or fried vegetables. Vegetables in a cheese sauce. Regular canned vegetables (not low-sodium or reduced-sodium). Regular canned  tomato sauce and paste (not low-sodium or reduced-sodium). Regular tomato and vegetable juice (not low-sodium or reduced-sodium). Angie Fava. Olives. Fruits Canned fruit in a light or heavy syrup. Fried fruit. Fruit in cream or butter sauce. Meat and other protein foods Fatty cuts of meat. Ribs. Fried meat. Berniece Salines. Sausage. Bologna and other processed lunch meats. Salami. Fatback. Hotdogs. Bratwurst. Salted nuts and seeds. Canned beans with added salt. Canned or smoked fish. Whole eggs or egg yolks. Chicken or [place] with skin. Dairy Whole or 2% milk, cream, and half-and-half. Whole or full-fat cream cheese. Whole-fat or sweetened yogurt. Full-fat cheese. Nondairy creamers. Whipped toppings. Processed cheese and cheese spreads. Fats and oils Butter. Stick margarine. Lard. Shortening. Ghee. Bacon fat. Tropical oils, such as coconut, palm kernel, or palm oil. Seasoning and other foods Salted popcorn and pretzels. Onion salt, garlic salt, seasoned salt, table salt, and sea salt. Worcestershire sauce. Tartar sauce. Barbecue sauce. Teriyaki sauce. Soy sauce, including reduced-sodium. Steak sauce. Canned and packaged gravies. Fish sauce. Oyster sauce. Cocktail sauce. Horseradish that you find on the shelf. Ketchup. Mustard. Meat flavorings and tenderizers. Bouillon cubes. Hot sauce and Tabasco sauce. Premade or packaged marinades. Premade or packaged taco seasonings. Relishes. Regular salad dressings. Where to find more information:  National Heart, Lung, and [place]: https://wilson-eaton.com/  American Heart Association: www.heart.org Summary  The DASH eating plan is a healthy eating plan that has been shown to reduce high blood pressure (hypertension). It may also reduce your risk for type 2 diabetes, heart disease, and stroke.  With the DASH eating plan, you should limit salt (sodium) intake to 2,300 mg a day. If you have hypertension, you may need to reduce your sodium intake to 1,500 mg a day.  When  on the DASH eating plan, aim to eat more fresh fruits and vegetables, whole grains, lean proteins, low-fat dairy, and heart-healthy fats.  Work with your health care provider or diet and nutrition specialist (dietitian) to adjust your eating plan to your individual calorie needs. This information is not intended to replace advice given to you by your health care provider. Make sure you discuss any questions you have with your health care provider. Document Released: 10/24/2011 Document Revised: 10/28/2016 Document Reviewed: 10/28/2016 Elsevier Interactive Patient Education  2017 [place].  Managing Your Hypertension Hypertension is commonly called high blood pressure. This is when the force of your blood pressing against the walls of your arteries is too strong. Arteries are blood vessels that carry blood from your heart throughout your body. Hypertension forces the heart to work harder to pump blood, and may cause the arteries to become narrow or stiff. Having untreated or uncontrolled hypertension can cause heart attack, stroke, kidney disease, and other problems. What are blood pressure readings? A blood pressure reading consists of a higher number over a lower number. Ideally, your blood pressure should be below 120/80. The first ("top") number is called the systolic pressure. It is a measure of the pressure in your arteries as  your heart beats. The second ("bottom") number is called the diastolic pressure. It is a measure of the pressure in your arteries as the heart relaxes. What does my blood pressure reading mean? Blood pressure is classified into four stages. Based on your blood pressure reading, your health care provider may use the following stages to determine what type of treatment you need, if any. Systolic pressure and diastolic pressure are measured in a unit called mm Hg. Normal  Systolic pressure: below 542.  Diastolic pressure: below 80. Elevated  Systolic pressure:  706-237.  Diastolic pressure: below 80. Hypertension stage 1  Systolic pressure: 628-315.  Diastolic pressure: 17-61. Hypertension stage 2  Systolic pressure: 607 or above.  Diastolic pressure: 90 or above. What health risks are associated with hypertension? Managing your hypertension is an important responsibility. Uncontrolled hypertension can lead to:  A heart attack.  A stroke.  A weakened blood vessel (aneurysm).  Heart failure.  Kidney damage.  Eye damage.  Metabolic syndrome.  Memory and concentration problems.  What changes can I make to manage my hypertension? Hypertension can be managed by making lifestyle changes and possibly by taking medicines. Your health care provider will help you make a plan to bring your blood pressure within a normal range. Eating and drinking  Eat a diet that is high in fiber and potassium, and low in salt (sodium), added sugar, and fat. An example eating plan is called the DASH (Dietary Approaches to Stop Hypertension) diet. To eat this way: ? Eat plenty of fresh fruits and vegetables. Try to fill half of your plate at each meal with fruits and vegetables. ? Eat whole grains, such as whole wheat pasta, brown rice, or whole grain bread. Fill about one quarter of your plate with whole grains. ? Eat low-fat diary products. ? Avoid fatty cuts of meat, processed or cured meats, and poultry with skin. Fill about one quarter of your plate with lean proteins such as fish, chicken without skin, beans, eggs, and tofu. ? Avoid premade and processed foods. These tend to be higher in sodium, added sugar, and fat.  Reduce your daily sodium intake. Most people with hypertension should eat less than 1,500 mg of sodium a day.  Limit alcohol intake to no more than 1 drink a day for nonpregnant women and 2 drinks a day for men. One drink equals 12 oz of beer, 5 oz of wine, or 1 oz of hard liquor. Lifestyle  Work with your health care provider to  maintain a healthy body weight, or to lose weight. Ask what an ideal weight is for you.  Get at least 30 minutes of exercise that causes your heart to beat faster (aerobic exercise) most days of the week. Activities may include walking, swimming, or biking.  Include exercise to strengthen your muscles (resistance exercise), such as weight lifting, as part of your weekly exercise routine. Try to do these types of exercises for 30 minutes at least 3 days a week.  Do not use any products that contain nicotine or tobacco, such as cigarettes and e-cigarettes. If you need help quitting, ask your health care provider.  Control any long-term (chronic) conditions you have, such as high cholesterol or diabetes. Monitoring  Monitor your blood pressure at home as told by your health care provider. Your personal target blood pressure may vary depending on your medical conditions, your age, and other factors.  Have your blood pressure checked regularly, as often as told by your health care provider.  Working with your health care provider  Review all the medicines you take with your health care provider because there may be side effects or interactions.  Talk with your health care provider about your diet, exercise habits, and other lifestyle factors that may be contributing to hypertension.  Visit your health care provider regularly. Your health care provider can help you create and adjust your plan for managing hypertension. Will I need medicine to control my blood pressure? Your health care provider may prescribe medicine if lifestyle changes are not enough to get your blood pressure under control, and if:  Your systolic blood pressure is 130 or higher.  Your diastolic blood pressure is 80 or higher.  Take medicines only as told by your health care provider. Follow the directions carefully. Blood pressure medicines must be taken as prescribed. The medicine does not work as well when you skip doses.  Skipping doses also puts you at risk for problems. Contact a health care provider if:  You think you are having a reaction to medicines you have taken.  You have repeated (recurrent) headaches.  You feel dizzy.  You have swelling in your ankles.  You have trouble with your vision. Get help right away if:  You develop a severe headache or confusion.  You have unusual weakness or numbness, or you feel faint.  You have severe pain in your chest or abdomen.  You vomit repeatedly.  You have trouble breathing. Summary  Hypertension is when the force of blood pumping through your arteries is too strong. If this condition is not controlled, it may put you at risk for serious complications.  Your personal target blood pressure may vary depending on your medical conditions, your age, and other factors. For most people, a normal blood pressure is less than 120/80.  Hypertension is managed by lifestyle changes, medicines, or both. Lifestyle changes include weight loss, eating a healthy, low-sodium diet, exercising more, and limiting alcohol. This information is not intended to replace advice given to you by your health care provider. Make sure you discuss any questions you have with your health care provider. Document Released: 07/29/2012 Document Revised: 10/02/2016 Document Reviewed: 10/02/2016 Elsevier Interactive Patient Education  [employer].

## 2017-10-22 ENCOUNTER — Telehealth: Payer: Self-pay | Admitting: Family Medicine

## 2017-10-22 NOTE — Telephone Encounter (Signed)
Lab results given to patient with recommendations per request. Pt stated that she will work on her diet more and activity. Discussed with her the high fiber foods.

## 2017-10-22 NOTE — Telephone Encounter (Signed)
noted 

## 2018-06-17 ENCOUNTER — Telehealth: Payer: Self-pay | Admitting: Family Medicine

## 2018-06-17 NOTE — Telephone Encounter (Signed)
Returned call to patient. She reports she checked her BP 2 Fridays ago and it was 121/87. Last Friday checked again and it was "elevated." She thought this might be due to being active/stressed. Took BP almost daily since then and it has continued to be "elevated." 164/94 today, which is about what it has averaged. Highest reading was ~175/104.  Patient endorses some fatigue, but "nothing out of the ordinary," mild HA, and some mild intermittent dizziness (none currently). Denies any visual changes. She is taking amlodipine 5 mg daily as directed.   Recommended office visit. Patient initially preferred to wait for PCP appointment early next week, but PCP is fully booked until next Thursday, so patient opted for sooner appt w/ another provider. Patient scheduled tomorrow at 3:15 w/ Dorothyann Peng, NP.

## 2018-06-17 NOTE — Telephone Encounter (Signed)
Copied from [place] [id number]. Topic: Quick Communication - See Telephone Encounter >> Jun 17, 2018  2:19 PM Patricia Pineda wrote: Needing to discuss blood work she thinks Dr. Volanda Napoleon wanted her to have doen and recent bp issues.  Pt cannot control bp levels

## 2018-06-18 ENCOUNTER — Ambulatory Visit: Payer: Managed Care, Other (non HMO) | Admitting: Adult Health

## 2018-06-25 ENCOUNTER — Ambulatory Visit: Payer: Managed Care, Other (non HMO) | Admitting: Family Medicine

## 2018-06-25 ENCOUNTER — Encounter: Payer: Self-pay | Admitting: Family Medicine

## 2018-06-25 VITALS — BP 148/98 | HR 64 | Temp 98.9°F | Wt 239.8 lb

## 2018-06-25 DIAGNOSIS — Z1211 Encounter for screening for malignant neoplasm of colon: Secondary | ICD-10-CM

## 2018-06-25 DIAGNOSIS — I10 Essential (primary) hypertension: Secondary | ICD-10-CM | POA: Diagnosis not present

## 2018-06-25 MED ORDER — AMLODIPINE BESYLATE 10 MG PO TABS: 10.0000 mg | ORAL_TABLET | Freq: Every day | ORAL | 3 refills | Status: DC

## 2018-06-25 NOTE — Progress Notes (Signed)
Subjective:    Patient ID: Patricia Pineda, female    DOB: [birth year], 52 y.o.   MRN: [id number]  Chief Complaint  Patient presents with  . Hypertension    checking bp at home the past 3 weeks , avg 150/90, 174/98 at 12:00am today,     HPI Patient was seen today for f/u on HTN.  Pt endorses compliance with norvasc 5 mg daily.  Notes bp has been elevated at work 150/90, 174/98 x 3 wks.  Pt endorses occasional slight HA, blurred vision.  Pt has been eating more fast food and cooking less.  Also endorses stress at work.  Pt also needs to schedule her colonoscopy.    Past Medical History:  Diagnosis Date  . Anemia   . Arthritis    knees  . High cholesterol   . Hypertension   . Pneumonia     No Known Allergies  ROS General: Denies fever, chills, night sweats, changes in weight, changes in appetite HEENT: Denies headaches, ear pain, changes in vision, rhinorrhea, sore throat  +HA, blurred vision CV: Denies CP, palpitations, SOB, orthopnea Pulm: Denies SOB, cough, wheezing GI: Denies abdominal pain, nausea, vomiting, diarrhea, constipation GU: Denies dysuria, hematuria, frequency, vaginal discharge Msk: Denies muscle cramps, joint pains Neuro: Denies weakness, numbness, tingling Skin: Denies rashes, bruising Psych: Denies depression, anxiety, hallucinations     Objective:    Blood pressure (!) 148/98, pulse 64, temperature 98.9 F (37.2 C), temperature source Oral, weight 239 lb 12.8 oz (108.8 kg), last menstrual period 08/17/2016, SpO2 99 %.   Gen. Pleasant, well-nourished, in no distress, normal affect   HEENT: [place]/AT, face symmetric, no scleral icterus, PERRLA, nares patent without drainage. Lungs: no accessory muscle use, CTAB, no wheezes or rales Cardiovascular: RRR, no m/r/g, no peripheral edema Neuro:  A&Ox3, CN II-XII intact, normal gait  Wt Readings from Last 3 Encounters:  06/25/18 239 lb 12.8 oz (108.8 kg)  10/15/17 236 lb 9.6 oz (107.3 kg)  11/04/16 232 lb 3.2  oz (105.3 kg)    Lab Results  Component Value Date   WBC 6.9 10/15/2017   HGB 14.3 10/15/2017   HCT 42.1 10/15/2017   PLT 434.0 (H) 10/15/2017   GLUCOSE 93 10/15/2017   CHOL 218 (H) 10/15/2017   TRIG 119.0 10/15/2017   HDL 46.00 10/15/2017   LDLDIRECT 147.0 05/11/2013   LDLCALC 148 (H) 10/15/2017   ALT 15 10/15/2017   AST 12 10/15/2017   NA 139 10/15/2017   K 4.0 10/15/2017   CL 104 10/15/2017   CREATININE 0.74 10/15/2017   BUN 14 10/15/2017   CO2 28 10/15/2017   TSH 1.95 10/15/2017   HGBA1C 5.8 10/15/2017    Assessment/Plan:  Essential hypertension  -elevated.  Repeat 136/89 -Discussed increasing Norvasc to 10 mg daily. -Lifestyle modifications encouraged including decreasing sodium intake, increasing p.o. intake of water, increasing physical activity and increasing vegetable intake. -Discussed may need additional medication to get better control of blood pressure - Plan: amLODipine (NORVASC) 10 MG tablet -Follow-up in 2 to 4 weeks to recheck blood pressure. -Patient to continue checking BP at home/work keeping a log.  Encounter for screening colonoscopy  - Plan: Ambulatory referral to Gastroenterology  Follow-up in 2 to 4 weeks for BP  Grier Mitts, MD

## 2018-06-25 NOTE — Patient Instructions (Signed)
DASH Eating Plan DASH stands for "Dietary Approaches to Stop Hypertension." The DASH eating plan is a healthy eating plan that has been shown to reduce high blood pressure (hypertension). It may also reduce your risk for type 2 diabetes, heart disease, and stroke. The DASH eating plan may also help with weight loss. What are tips for following this plan? General guidelines  Avoid eating more than 2,300 mg (milligrams) of salt (sodium) a day. If you have hypertension, you may need to reduce your sodium intake to 1,500 mg a day.  Limit alcohol intake to no more than 1 drink a day for nonpregnant women and 2 drinks a day for men. One drink equals 12 oz of beer, 5 oz of wine, or 1 oz of hard liquor.  Work with your health care provider to maintain a healthy body weight or to lose weight. Ask what an ideal weight is for you.  Get at least 30 minutes of exercise that causes your heart to beat faster (aerobic exercise) most days of the week. Activities may include walking, swimming, or biking.  Work with your health care provider or diet and nutrition specialist (dietitian) to adjust your eating plan to your individual calorie needs. Reading food labels  Check food labels for the amount of sodium per serving. Choose foods with less than 5 percent of the Daily Value of sodium. Generally, foods with less than 300 mg of sodium per serving fit into this eating plan.  To find whole grains, look for the word "whole" as the first word in the ingredient list. Shopping  Buy products labeled as "low-sodium" or "no salt added."  Buy fresh foods. Avoid canned foods and premade or frozen meals. Cooking  Avoid adding salt when cooking. Use salt-free seasonings or herbs instead of table salt or sea salt. Check with your health care provider or pharmacist before using salt substitutes.  Do not fry foods. Cook foods using healthy methods such as baking, boiling, grilling, and broiling instead.  Cook with  heart-healthy oils, such as olive, canola, soybean, or sunflower oil. Meal planning   Eat a balanced diet that includes: ? 5 or more servings of fruits and vegetables each day. At each meal, try to fill half of your plate with fruits and vegetables. ? Up to 6-8 servings of whole grains each day. ? Less than 6 oz of lean meat, poultry, or fish each day. A 3-oz serving of meat is about the same size as a deck of cards. One egg equals 1 oz. ? 2 servings of low-fat dairy each day. ? A serving of nuts, seeds, or beans 5 times each week. ? Heart-healthy fats. Healthy fats called Omega-3 fatty acids are found in foods such as flaxseeds and coldwater fish, like sardines, salmon, and mackerel.  Limit how much you eat of the following: ? Canned or prepackaged foods. ? Food that is high in trans fat, such as fried foods. ? Food that is high in saturated fat, such as fatty meat. ? Sweets, desserts, sugary drinks, and other foods with added sugar. ? Full-fat dairy products.  Do not salt foods before eating.  Try to eat at least 2 vegetarian meals each week.  Eat more home-cooked food and less restaurant, buffet, and fast food.  When eating at a restaurant, ask that your food be prepared with less salt or no salt, if possible. What foods are recommended? The items listed may not be a complete list. Talk with your dietitian about what   dietary choices are best for you. Grains Whole-grain or whole-wheat bread. Whole-grain or whole-wheat pasta. Brown rice. Oatmeal. Quinoa. Bulgur. Whole-grain and low-sodium cereals. Pita bread. Low-fat, low-sodium crackers. Whole-wheat flour tortillas. Vegetables Fresh or frozen vegetables (raw, steamed, roasted, or grilled). Low-sodium or reduced-sodium tomato and vegetable juice. Low-sodium or reduced-sodium tomato sauce and tomato paste. Low-sodium or reduced-sodium canned vegetables. Fruits All fresh, dried, or frozen fruit. Canned fruit in natural juice (without  added sugar). Meat and other protein foods Skinless chicken or turkey. Ground chicken or turkey. Pork with fat trimmed off. Fish and seafood. Egg whites. Dried beans, peas, or lentils. Unsalted nuts, nut butters, and seeds. Unsalted canned beans. Lean cuts of beef with fat trimmed off. Low-sodium, lean deli meat. Dairy Low-fat (1%) or fat-free (skim) milk. Fat-free, low-fat, or reduced-fat cheeses. Nonfat, low-sodium ricotta or cottage cheese. Low-fat or nonfat yogurt. Low-fat, low-sodium cheese. Fats and oils Soft margarine without trans fats. Vegetable oil. Low-fat, reduced-fat, or light mayonnaise and salad dressings (reduced-sodium). Canola, safflower, olive, soybean, and sunflower oils. Avocado. Seasoning and other foods Herbs. Spices. Seasoning mixes without salt. Unsalted popcorn and pretzels. Fat-free sweets. What foods are not recommended? The items listed may not be a complete list. Talk with your dietitian about what dietary choices are best for you. Grains Baked goods made with fat, such as croissants, muffins, or some breads. Dry pasta or rice meal packs. Vegetables Creamed or fried vegetables. Vegetables in a cheese sauce. Regular canned vegetables (not low-sodium or reduced-sodium). Regular canned tomato sauce and paste (not low-sodium or reduced-sodium). Regular tomato and vegetable juice (not low-sodium or reduced-sodium). Pickles. Olives. Fruits Canned fruit in a light or heavy syrup. Fried fruit. Fruit in cream or butter sauce. Meat and other protein foods Fatty cuts of meat. Ribs. Fried meat. Bacon. Sausage. Bologna and other processed lunch meats. Salami. Fatback. Hotdogs. Bratwurst. Salted nuts and seeds. Canned beans with added salt. Canned or smoked fish. Whole eggs or egg yolks. Chicken or turkey with skin. Dairy Whole or 2% milk, cream, and half-and-half. Whole or full-fat cream cheese. Whole-fat or sweetened yogurt. Full-fat cheese. Nondairy creamers. Whipped toppings.  Processed cheese and cheese spreads. Fats and oils Butter. Stick margarine. Lard. Shortening. Ghee. Bacon fat. Tropical oils, such as coconut, palm kernel, or palm oil. Seasoning and other foods Salted popcorn and pretzels. Onion salt, garlic salt, seasoned salt, table salt, and sea salt. Worcestershire sauce. Tartar sauce. Barbecue sauce. Teriyaki sauce. Soy sauce, including reduced-sodium. Steak sauce. Canned and packaged gravies. Fish sauce. Oyster sauce. Cocktail sauce. Horseradish that you find on the shelf. Ketchup. Mustard. Meat flavorings and tenderizers. Bouillon cubes. Hot sauce and Tabasco sauce. Premade or packaged marinades. Premade or packaged taco seasonings. Relishes. Regular salad dressings. Where to find more information:  National Heart, Lung, and Blood Institute: www.nhlbi.nih.gov  American Heart Association: www.heart.org Summary  The DASH eating plan is a healthy eating plan that has been shown to reduce high blood pressure (hypertension). It may also reduce your risk for type 2 diabetes, heart disease, and stroke.  With the DASH eating plan, you should limit salt (sodium) intake to 2,300 mg a day. If you have hypertension, you may need to reduce your sodium intake to 1,500 mg a day.  When on the DASH eating plan, aim to eat more fresh fruits and vegetables, whole grains, lean proteins, low-fat dairy, and heart-healthy fats.  Work with your health care provider or diet and nutrition specialist (dietitian) to adjust your eating plan to your individual   calorie needs. This information is not intended to replace advice given to you by your health care provider. Make sure you discuss any questions you have with your health care provider. Document Released: 10/24/2011 Document Revised: 10/28/2016 Document Reviewed: 10/28/2016 Elsevier Interactive Patient Education  2018 [employer].  Managing Your Hypertension Hypertension is commonly called high blood pressure. This is when  the force of your blood pressing against the walls of your arteries is too strong. Arteries are blood vessels that carry blood from your heart throughout your body. Hypertension forces the heart to work harder to pump blood, and may cause the arteries to become narrow or stiff. Having untreated or uncontrolled hypertension can cause heart attack, stroke, kidney disease, and other problems. What are blood pressure readings? A blood pressure reading consists of a higher number over a lower number. Ideally, your blood pressure should be below 120/80. The first ("top") number is called the systolic pressure. It is a measure of the pressure in your arteries as your heart beats. The second ("bottom") number is called the diastolic pressure. It is a measure of the pressure in your arteries as the heart relaxes. What does my blood pressure reading mean? Blood pressure is classified into four stages. Based on your blood pressure reading, your health care provider may use the following stages to determine what type of treatment you need, if any. Systolic pressure and diastolic pressure are measured in a unit called mm Hg. Normal  Systolic pressure: below 474.  Diastolic pressure: below 80. Elevated  Systolic pressure: 259-563.  Diastolic pressure: below 80. Hypertension stage 1  Systolic pressure: 875-643.  Diastolic pressure: 32-95. Hypertension stage 2  Systolic pressure: 188 or above.  Diastolic pressure: 90 or above. What health risks are associated with hypertension? Managing your hypertension is an important responsibility. Uncontrolled hypertension can lead to:  A heart attack.  A stroke.  A weakened blood vessel (aneurysm).  Heart failure.  Kidney damage.  Eye damage.  Metabolic syndrome.  Memory and concentration problems.  What changes can I make to manage my hypertension? Hypertension can be managed by making lifestyle changes and possibly by taking medicines. Your  health care provider will help you make a plan to bring your blood pressure within a normal range. Eating and drinking  Eat a diet that is high in fiber and potassium, and low in salt (sodium), added sugar, and fat. An example eating plan is called the DASH (Dietary Approaches to Stop Hypertension) diet. To eat this way: ? Eat plenty of fresh fruits and vegetables. Try to fill half of your plate at each meal with fruits and vegetables. ? Eat whole grains, such as whole wheat pasta, brown rice, or whole grain bread. Fill about one quarter of your plate with whole grains. ? Eat low-fat diary products. ? Avoid fatty cuts of meat, processed or cured meats, and poultry with skin. Fill about one quarter of your plate with lean proteins such as fish, chicken without skin, beans, eggs, and tofu. ? Avoid premade and processed foods. These tend to be higher in sodium, added sugar, and fat.  Reduce your daily sodium intake. Most people with hypertension should eat less than 1,500 mg of sodium a day.  Limit alcohol intake to no more than 1 drink a day for nonpregnant women and 2 drinks a day for men. One drink equals 12 oz of beer, 5 oz of wine, or 1 oz of hard liquor. Lifestyle  Work with your health care provider  to maintain a healthy body weight, or to lose weight. Ask what an ideal weight is for you.  Get at least 30 minutes of exercise that causes your heart to beat faster (aerobic exercise) most days of the week. Activities may include walking, swimming, or biking.  Include exercise to strengthen your muscles (resistance exercise), such as weight lifting, as part of your weekly exercise routine. Try to do these types of exercises for 30 minutes at least 3 days a week.  Do not use any products that contain nicotine or tobacco, such as cigarettes and e-cigarettes. If you need help quitting, ask your health care provider.  Control any long-term (chronic) conditions you have, such as high cholesterol  or diabetes. Monitoring  Monitor your blood pressure at home as told by your health care provider. Your personal target blood pressure may vary depending on your medical conditions, your age, and other factors.  Have your blood pressure checked regularly, as often as told by your health care provider. Working with your health care provider  Review all the medicines you take with your health care provider because there may be side effects or interactions.  Talk with your health care provider about your diet, exercise habits, and other lifestyle factors that may be contributing to hypertension.  Visit your health care provider regularly. Your health care provider can help you create and adjust your plan for managing hypertension. Will I need medicine to control my blood pressure? Your health care provider may prescribe medicine if lifestyle changes are not enough to get your blood pressure under control, and if:  Your systolic blood pressure is 130 or higher.  Your diastolic blood pressure is 80 or higher.  Take medicines only as told by your health care provider. Follow the directions carefully. Blood pressure medicines must be taken as prescribed. The medicine does not work as well when you skip doses. Skipping doses also puts you at risk for problems. Contact a health care provider if:  You think you are having a reaction to medicines you have taken.  You have repeated (recurrent) headaches.  You feel dizzy.  You have swelling in your ankles.  You have trouble with your vision. Get help right away if:  You develop a severe headache or confusion.  You have unusual weakness or numbness, or you feel faint.  You have severe pain in your chest or abdomen.  You vomit repeatedly.  You have trouble breathing. Summary  Hypertension is when the force of blood pumping through your arteries is too strong. If this condition is not controlled, it may put you at risk for serious  complications.  Your personal target blood pressure may vary depending on your medical conditions, your age, and other factors. For most people, a normal blood pressure is less than 120/80.  Hypertension is managed by lifestyle changes, medicines, or both. Lifestyle changes include weight loss, eating a healthy, low-sodium diet, exercising more, and limiting alcohol. This information is not intended to replace advice given to you by your health care provider. Make sure you discuss any questions you have with your health care provider. Document Released: 07/29/2012 Document Revised: 10/02/2016 Document Reviewed: 10/02/2016 Elsevier Interactive Patient Education  2018 [employer] Inc[employer]  

## 2018-06-26 ENCOUNTER — Ambulatory Visit: Payer: Managed Care, Other (non HMO) | Admitting: Adult Health

## 2018-07-30 ENCOUNTER — Encounter: Payer: Self-pay | Admitting: Family Medicine

## 2018-09-19 ENCOUNTER — Other Ambulatory Visit: Payer: Self-pay | Admitting: Family Medicine

## 2018-09-19 DIAGNOSIS — I10 Essential (primary) hypertension: Secondary | ICD-10-CM

## 2018-11-19 ENCOUNTER — Telehealth: Payer: Self-pay | Admitting: *Deleted

## 2018-11-19 NOTE — Telephone Encounter (Signed)
Copied from [id number] [phone]. Topic: Appointment Scheduling - Scheduling Inquiry for Clinic >> Nov 19, 2018  4:21 PM Sheran Luz wrote: Reason for CRM: Patient would like to know if Dr. Volanda Napoleon would be willing to work her in tomorrow, 1/3, as a follow up to be diagnosed with [facility]. Patient is requesting a call back at [phone]

## 2018-11-19 NOTE — Telephone Encounter (Signed)
Spoke with patient and explained that Dr Volanda Napoleon' schedule is full and offered an appointment next week.  Patient denied at this time and states she will call back if an appointment is needed.

## 2018-11-20 ENCOUNTER — Encounter: Payer: Self-pay | Admitting: Family Medicine

## 2018-11-20 ENCOUNTER — Ambulatory Visit: Payer: Managed Care, Other (non HMO) | Admitting: Family Medicine

## 2018-11-20 VITALS — BP 124/80 | HR 70 | Temp 98.8°F | Ht 65.0 in | Wt 238.8 lb

## 2018-11-20 DIAGNOSIS — J180 Bronchopneumonia, unspecified organism: Secondary | ICD-10-CM | POA: Diagnosis not present

## 2018-11-20 DIAGNOSIS — R05 Cough: Secondary | ICD-10-CM

## 2018-11-20 DIAGNOSIS — R059 Cough, unspecified: Secondary | ICD-10-CM

## 2018-11-20 DIAGNOSIS — J189 Pneumonia, unspecified organism: Secondary | ICD-10-CM | POA: Diagnosis not present

## 2018-11-20 MED ORDER — IPRATROPIUM BROMIDE 0.06 % NA SOLN: 2.0000 | Freq: Four times a day (QID) | NASAL | 0 refills | Status: DC

## 2018-11-20 MED ORDER — GUAIFENESIN-CODEINE 100-10 MG/5ML PO SOLN: 5.0000 mL | Freq: Three times a day (TID) | ORAL | 0 refills | Status: DC | PRN

## 2018-11-20 MED ORDER — METHYLPREDNISOLONE ACETATE 80 MG/ML IJ SUSP
80.0000 mg | Freq: Once | INTRAMUSCULAR | Status: AC
  Administered 2018-11-20: 80 mg via INTRAMUSCULAR

## 2018-11-20 MED ORDER — DOXYCYCLINE HYCLATE 100 MG PO TABS
100.0000 mg | ORAL_TABLET | Freq: Two times a day (BID) | ORAL | 0 refills | Status: DC
Start: 2018-11-20 — End: 2018-12-14

## 2018-11-20 NOTE — Patient Instructions (Signed)
Start the atrovent and doxycycline.  Start cough syrup for your cough.  We will give you a cortisone shot today.   Please stay well hydrated.  You can take tylenol and/or motrin as needed for low grade fever and pain.  Please let me know if your symptoms worsen or fail to improve.  Take care, Dr Jerline Pain

## 2018-11-20 NOTE — Progress Notes (Signed)
   Subjective:  Patricia Pineda is a 53 y.o. female who presents today for same-day appointment with a chief complaint of cough.   HPI:  Cough, Acute problem Started about a week ago.  She went to a [facility] 4 days ago and was diagnosed with flu.  She was treated with Lewayne Bunting.  Over the last few days, her symptoms have continued to worsen.  She has had intermittent chills.  No fevers.  Cough is been nonproductive.  She has also developed some upper back pain as well.  She has tried over-the-counter treatments including Mucinex which has not helped.  She has been around several other sick contacts at work.  No other obvious alleviating or aggravating factors.   ROS: Per HPI  PMH: She reports that she has never smoked. She has never used smokeless tobacco. She reports current alcohol use of about 1.0 standard drinks of alcohol per week. She reports that she does not use drugs.  Objective:  Physical Exam: BP 124/80 (BP Location: Left Arm, Patient Position: Sitting, Cuff Size: Large)   Pulse 70   Temp 98.8 F (37.1 C) (Oral)   Ht 5\' 5"  (1.651 m)   Wt 238 lb 12 oz (108.3 kg)   LMP 08/17/2016 (Exact Date)   SpO2 96%   BMI 39.73 kg/m   Gen: NAD, resting comfortably HEENT: TMs clear.  OP clear.  Nose mucosa erythematous and boggy with clear discharge. CV: RRR with no murmurs appreciated Pulm: NWOB, diffuse wheeze and rhonchi throughout all lung fields.  Good airflow throughout.  Assessment/Plan:  Cough/pneumonia Patient with concern for possible pneumonia based on her lung exam, though she is afebrile today.  Will start doxycycline 100 mg twice daily to cover common pneumonia pathogens as well as post influenza pathogens including staph.  Will give 80 mg IM Depo-Medrol today.  We will start guaifenesin-codeine cough syrup.  Start Atrovent nasal spray for rhinorrhea and sinus congestion.  Recommended good oral hydration.  Recommended Tylenol and/or Motrin as needed.  Discussed  reasons to return to care.  Follow-up as needed.  Algis Greenhouse. Jerline Pain, MD 11/20/2018 11:40 AM

## 2018-11-23 ENCOUNTER — Ambulatory Visit: Payer: Self-pay | Admitting: Family Medicine

## 2018-11-23 NOTE — Telephone Encounter (Signed)
[phone] 

## 2018-11-26 ENCOUNTER — Encounter: Payer: Self-pay | Admitting: Gastroenterology

## 2018-12-14 ENCOUNTER — Encounter: Payer: Self-pay | Admitting: Gastroenterology

## 2018-12-14 ENCOUNTER — Ambulatory Visit (AMBULATORY_SURGERY_CENTER): Payer: Self-pay

## 2018-12-14 VITALS — Ht 66.0 in | Wt 242.2 lb

## 2018-12-14 DIAGNOSIS — Z1211 Encounter for screening for malignant neoplasm of colon: Secondary | ICD-10-CM

## 2018-12-14 MED ORDER — NA SULFATE-K SULFATE-MG SULF 17.5-3.13-1.6 GM/177ML PO SOLN: 1.0000 | Freq: Once | ORAL | 0 refills | Status: AC

## 2018-12-14 NOTE — Progress Notes (Signed)
Denies allergies to eggs or soy products. Denies complication of anesthesia or sedation. Denies use of weight loss medication. Denies use of O2.   Emmi instructions declined.  A 15.00 coupon for Suprep was given to the patient.

## 2018-12-24 ENCOUNTER — Ambulatory Visit (AMBULATORY_SURGERY_CENTER): Payer: Managed Care, Other (non HMO) | Admitting: Gastroenterology

## 2018-12-24 ENCOUNTER — Encounter: Payer: Self-pay | Admitting: Gastroenterology

## 2018-12-24 VITALS — BP 136/80 | HR 67 | Temp 97.3°F | Resp 16 | Ht 66.0 in | Wt 242.0 lb

## 2018-12-24 DIAGNOSIS — D125 Benign neoplasm of sigmoid colon: Secondary | ICD-10-CM

## 2018-12-24 DIAGNOSIS — Z1211 Encounter for screening for malignant neoplasm of colon: Secondary | ICD-10-CM | POA: Diagnosis not present

## 2018-12-24 DIAGNOSIS — D123 Benign neoplasm of transverse colon: Secondary | ICD-10-CM | POA: Diagnosis not present

## 2018-12-24 MED ORDER — SODIUM CHLORIDE 0.9 % IV SOLN: 500.0000 mL | Freq: Once | INTRAVENOUS | Status: AC

## 2018-12-24 NOTE — Op Note (Signed)
[place] Patient Name: Patricia Pineda Procedure Date: 12/24/2018 9:08 AM MRN: [id number] Endoscopist: Remo Lipps [name] , MD Age: 53 Referring MD:  Date of Birth: 24-Nov-1965 Gender: Female Account #: [id number] Procedure:                Colonoscopy Indications:              Screening for colorectal malignant neoplasm, This                            is the patient's first colonoscopy Medicines:                Monitored Anesthesia Care Procedure:                Pre-Anesthesia Assessment:                           - Prior to the procedure, a History and Physical                            was performed, and patient medications and                            allergies were reviewed. The patient's tolerance of                            previous anesthesia was also reviewed. The risks                            and benefits of the procedure and the sedation                            options and risks were discussed with the patient.                            All questions were answered, and informed consent                            was obtained. Prior Anticoagulants: The patient has                            taken no previous anticoagulant or antiplatelet                            agents. ASA Grade Assessment: II - A patient with                            mild systemic disease. After reviewing the risks                            and benefits, the patient was deemed in                            satisfactory condition to undergo the procedure.  After obtaining informed consent, the colonoscope                            was passed under direct vision. Throughout the                            procedure, the patient's blood pressure, pulse, and                            oxygen saturations were monitored continuously. The                            Colonoscope was introduced through the anus and                            advanced to the the  cecum, identified by                            appendiceal orifice and ileocecal valve. The                            colonoscopy was performed without difficulty. The                            patient tolerated the procedure well. The quality                            of the bowel preparation was good. The ileocecal                            valve, appendiceal orifice, and rectum were                            photographed. Scope In: 9:24:09 AM Scope Out: 9:48:29 AM Scope Withdrawal Time: 0 hours 18 minutes 20 seconds  Total Procedure Duration: 0 hours 24 minutes 20 seconds  Findings:                 The perianal and digital rectal examinations were                            normal.                           Many medium-mouthed diverticula were found in the                            entire colon, highest burden in the sigmoid colon                            (severe) with mild involvement of the transverse                            and right colon.  A 2 to 3 mm polyp was found in the hepatic flexure.                            The polyp was sessile. The polyp was removed with a                            cold biopsy forceps. Resection and retrieval were                            complete.                           A 2 to 3 mm polyp was found in the sigmoid colon.                            The polyp was sessile. The polyp was removed with a                            cold biopsy forceps. Resection and retrieval were                            complete.                           The lens washer had some technical issues during                            the case which took some time to fixe and prolonged                            this procedure. The exam was otherwise without                            abnormality. Complications:            No immediate complications. Estimated blood loss:                            Minimal. Estimated Blood Loss:      Estimated blood loss was minimal. Impression:               - Diverticulosis in the entire examined colon,                            highest burden in the sigmoid colon.                           - One 2 to 3 mm polyp at the hepatic flexure,                            removed with a cold biopsy forceps. Resected and                            retrieved.                           -  One 2 to 3 mm polyp in the sigmoid colon, removed                            with a cold biopsy forceps. Resected and retrieved.                           - The examination was otherwise normal. Recommendation:           - Patient has a contact number available for                            emergencies. The signs and symptoms of potential                            delayed complications were discussed with the                            patient. Return to normal activities tomorrow.                            Written discharge instructions were provided to the                            patient.                           - Resume previous diet.                           - Continue present medications.                           - Await pathology results. Remo Lipps P. [name], MD 12/24/2018 9:53:26 AM This report has been signed electronically.

## 2018-12-24 NOTE — Progress Notes (Signed)
Pt's states no medical or surgical changes since previsit or office visit. 

## 2018-12-24 NOTE — Patient Instructions (Signed)
YOU HAD AN ENDOSCOPIC PROCEDURE TODAY AT THE [facility]:   Refer to the procedure report that was given to you for any specific questions about what was found during the examination.  If the procedure report does not answer your questions, please call your gastroenterologist to clarify.  If you requested that your care partner not be given the details of your procedure findings, then the procedure report has been included in a sealed envelope for you to review at your convenience later.  YOU SHOULD EXPECT: Some feelings of bloating in the abdomen. Passage of more gas than usual.  Walking can help get rid of the air that was put into your GI tract during the procedure and reduce the bloating. If you had a lower endoscopy (such as a colonoscopy or flexible sigmoidoscopy) you may notice spotting of blood in your stool or on the toilet paper. If you underwent a bowel prep for your procedure, you may not have a normal bowel movement for a few days.  Please Note:  You might notice some irritation and congestion in your nose or some drainage.  This is from the oxygen used during your procedure.  There is no need for concern and it should clear up in a day or so.  SYMPTOMS TO REPORT IMMEDIATELY:   Following lower endoscopy (colonoscopy or flexible sigmoidoscopy):  Excessive amounts of blood in the stool  Significant tenderness or worsening of abdominal pains  Swelling of the abdomen that is new, acute  Fever of 100F or higher  For urgent or emergent issues, a gastroenterologist can be reached at any hour by calling [phone].   DIET:  We do recommend a small meal at first, but then you may proceed to your regular diet.  Drink plenty of fluids but you should avoid alcoholic beverages for 24 hours.  MEDICATIONS: Continue present medications.  Please see handouts given to you by your recovery nurse.  ACTIVITY:  You should plan to take it easy for the rest of today and you should NOT  DRIVE or use heavy machinery until tomorrow (because of the sedation medicines used during the test).    FOLLOW UP: Our staff will call the number listed on your records the next business day following your procedure to check on you and address any questions or concerns that you may have regarding the information given to you following your procedure. If we do not reach you, we will leave a message.  However, if you are feeling well and you are not experiencing any problems, there is no need to return our call.  We will assume that you have returned to your regular daily activities without incident.  If any biopsies were taken you will be contacted by phone or by letter within the next 1-3 weeks.  Please call us at [phone] if you have not heard about the biopsies in 3 weeks.   Thank you for allowing us to provide for your healthcare needs today.   SIGNATURES/CONFIDENTIALITY: You and/or your care partner have signed paperwork which will be entered into your electronic medical record.  These signatures attest to the fact that that the information above on your After Visit Summary has been reviewed and is understood.  Full responsibility of the confidentiality of this discharge information lies with you and/or your care-partner. 

## 2018-12-24 NOTE — Progress Notes (Signed)
To PACU, VSS. Report to [email or web address] 

## 2018-12-24 NOTE — Progress Notes (Signed)
Called to room to assist during endoscopic procedure.  Patient ID and intended procedure confirmed with present staff. Received instructions for my participation in the procedure from the performing physician.  

## 2018-12-25 ENCOUNTER — Telehealth: Payer: Self-pay | Admitting: *Deleted

## 2018-12-25 NOTE — Telephone Encounter (Signed)
  Follow up Call-  Call back number 12/24/2018  Post procedure Call Back phone  # [phone]  Permission to leave phone message Yes  Some recent data might be hidden     Patient questions:  Do you have a fever, pain , or abdominal swelling? No. Pain Score  0 *  Have you tolerated food without any problems? Yes.    Have you been able to return to your normal activities? Yes.    Do you have any questions about your discharge instructions: Diet   No. Medications  No. Follow up visit  No.  Do you have questions or concerns about your Care? No.  Actions: * If pain score is 4 or above: No action needed, pain <4.

## 2019-01-06 ENCOUNTER — Encounter: Payer: Self-pay | Admitting: Gastroenterology

## 2019-01-28 ENCOUNTER — Encounter: Payer: Self-pay | Admitting: Family Medicine

## 2019-01-28 ENCOUNTER — Ambulatory Visit (INDEPENDENT_AMBULATORY_CARE_PROVIDER_SITE_OTHER): Payer: Managed Care, Other (non HMO) | Admitting: Family Medicine

## 2019-01-28 ENCOUNTER — Other Ambulatory Visit: Payer: Self-pay

## 2019-01-28 ENCOUNTER — Encounter: Payer: Managed Care, Other (non HMO) | Admitting: Family Medicine

## 2019-01-28 VITALS — BP 120/80 | HR 84 | Temp 99.0°F | Wt 239.0 lb

## 2019-01-28 DIAGNOSIS — Z131 Encounter for screening for diabetes mellitus: Secondary | ICD-10-CM | POA: Diagnosis not present

## 2019-01-28 DIAGNOSIS — E782 Mixed hyperlipidemia: Secondary | ICD-10-CM | POA: Diagnosis not present

## 2019-01-28 DIAGNOSIS — Z0000 Encounter for general adult medical examination without abnormal findings: Secondary | ICD-10-CM | POA: Diagnosis not present

## 2019-01-28 DIAGNOSIS — R5383 Other fatigue: Secondary | ICD-10-CM | POA: Diagnosis not present

## 2019-01-28 DIAGNOSIS — I10 Essential (primary) hypertension: Secondary | ICD-10-CM | POA: Diagnosis not present

## 2019-01-28 DIAGNOSIS — E6609 Other obesity due to excess calories: Secondary | ICD-10-CM

## 2019-01-28 DIAGNOSIS — Z6838 Body mass index (BMI) 38.0-38.9, adult: Secondary | ICD-10-CM

## 2019-01-28 LAB — BASIC METABOLIC PANEL
BUN: 18 mg/dL (ref 6–23)
CO2: 29 mEq/L (ref 19–32)
Calcium: 10.1 mg/dL (ref 8.4–10.5)
Chloride: 102 mEq/L (ref 96–112)
Creatinine, Ser: 0.78 mg/dL (ref 0.40–1.20)
GFR: 93.81 mL/min (ref >60.00)
GLUCOSE: 85 mg/dL (ref 70–99)
Potassium: 4.1 mEq/L (ref 3.5–5.1)
Sodium: 140 mEq/L (ref 135–145)

## 2019-01-28 LAB — CBC WITH DIFFERENTIAL/PLATELET
Basophils Absolute: 0 10*3/uL (ref 0.0–0.1)
Basophils Relative: 0.4 % (ref 0.0–3.0)
Eosinophils Absolute: 0.2 10*3/uL (ref 0.0–0.7)
Eosinophils Relative: 2.3 % (ref 0.0–5.0)
HCT: 41.2 % (ref 36.0–46.0)
Hemoglobin: 14.1 g/dL (ref 12.0–15.0)
LYMPHS ABS: 3.6 10*3/uL (ref 0.7–4.0)
Lymphocytes Relative: 42.4 % (ref 12.0–46.0)
MCHC: 34.3 g/dL (ref 30.0–36.0)
MCV: 82.1 fl (ref 78.0–100.0)
Monocytes Absolute: 0.6 10*3/uL (ref 0.1–1.0)
Monocytes Relative: 6.7 % (ref 3.0–12.0)
Neutro Abs: 4.1 10*3/uL (ref 1.4–7.7)
Neutrophils Relative %: 48.2 % (ref 43.0–77.0)
Platelets: 426 10*3/uL — ABNORMAL HIGH (ref 150.0–400.0)
RBC: 5.02 Mil/uL (ref 3.87–5.11)
RDW: 14.7 % (ref 11.5–15.5)
WBC: 8.6 10*3/uL (ref 4.0–10.5)

## 2019-01-28 LAB — LIPID PANEL
Cholesterol: 257 mg/dL — ABNORMAL HIGH (ref 0–200)
HDL: 58 mg/dL (ref >39.00)
LDL Cholesterol: 175 mg/dL — ABNORMAL HIGH (ref 0–99)
NonHDL: 199.49
Total CHOL/HDL Ratio: 4
Triglycerides: 122 mg/dL (ref 0.0–149.0)
VLDL: 24.4 mg/dL (ref 0.0–40.0)

## 2019-01-28 LAB — VITAMIN D 25 HYDROXY (VIT D DEFICIENCY, FRACTURES): VITD: 16.68 ng/mL — ABNORMAL LOW (ref 30.00–100.00)

## 2019-01-28 LAB — HEMOGLOBIN A1C: Hgb A1c MFr Bld: 5.9 % (ref 4.6–6.5)

## 2019-01-28 LAB — TSH: TSH: 1.28 u[IU]/mL (ref 0.35–4.50)

## 2019-01-28 LAB — T4, FREE: Free T4: 0.72 ng/dL (ref 0.60–1.60)

## 2019-01-28 NOTE — Progress Notes (Signed)
Subjective:  At time of pt's visit Epic was down.    Patricia Pineda is a 53 y.o. female and is here for a comprehensive physical exam. The patient reports no problems.  Mammogram-2019 Pap smear-2019 Influenza vaccin given at [facility] October November 2019 Colonoscopy-12/24/2018.  Due for repeat in 5 years as a few polyps and diverticulosis were noted. Social History   Socioeconomic History  . Marital status: Married    Spouse name: Not on file  . Number of children: Not on file  . Years of education: Not on file  . Highest education level: Not on file  Occupational History  . Occupation: Web designer  Social Needs  . Financial resource strain: Not on file  . Food insecurity:    Worry: Not on file    Inability: Not on file  . Transportation needs:    Medical: Not on file    Non-medical: Not on file  Tobacco Use  . Smoking status: Never Smoker  . Smokeless tobacco: Never Used  Substance and Sexual Activity  . Alcohol use: Yes    Alcohol/week: 1.0 standard drinks    Types: 1 Glasses of wine per week    Comment: occassionally  . Drug use: No  . Sexual activity: Yes    Partners: Female    Birth control/protection: Surgical  Lifestyle  . Physical activity:    Days per week: Not on file    Minutes per session: Not on file  . Stress: Not on file  Relationships  . Social connections:    Talks on phone: Not on file    Gets together: Not on file    Attends religious service: Not on file    Active member of club or organization: Not on file    Attends meetings of clubs or organizations: Not on file    Relationship status: Not on file  . Intimate partner violence:    Fear of current or ex partner: Not on file    Emotionally abused: Not on file    Physically abused: Not on file    Forced sexual activity: Not on file  Other Topics Concern  . Not on file  Social History Narrative   28 year old daughter and 45 year old son   Health Maintenance  Topic Date  Due  . HIV Screening  12/31/1981  . INFLUENZA VACCINE  06/18/2018  . MAMMOGRAM  06/16/2020  . PAP SMEAR-Modifier  06/16/2021  . TETANUS/TDAP  03/18/2022  . COLONOSCOPY  12/25/2023    The following portions of the patient's history were reviewed and updated as appropriate: allergies, current medications, past family history, past medical history, past social history, past surgical history and problem list.  Review of Systems Pertinent items noted in HPI and remainder of comprehensive ROS otherwise negative.   Objective:    BP 120/80 (BP Location: Left Arm, Patient Position: Sitting, Cuff Size: Large)   Pulse 84   Temp 99 F (37.2 C) (Oral)   Wt 239 lb (108.4 kg)   LMP 08/17/2016 (Exact Date)   SpO2 98%   BMI 38.58 kg/m  General appearance: alert, cooperative and no distress Head: Normocephalic, without obvious abnormality, atraumatic Eyes: conjunctivae/corneas clear. PERRL, EOM's intact. Fundi benign. Ears: normal TM's and external ear canals both ears Nose: Nares normal. Septum midline. Mucosa normal. No drainage or sinus tenderness. Throat: lips, mucosa, and tongue normal; teeth and gums normal Neck: no adenopathy, no carotid bruit, no JVD, supple, symmetrical, trachea midline and  thyroid not enlarged, symmetric, no tenderness/mass/nodules Lungs: clear to auscultation bilaterally Heart: regular rate and rhythm, S1, S2 normal, no murmur, click, rub or gallop Abdomen: soft, non-tender; bowel sounds normal; no masses,  no organomegaly Extremities: extremities normal, atraumatic, no cyanosis or edema Skin: Skin color, texture, turgor normal. No rashes or lesions Lymph nodes: Cervical, supraclavicular, and axillary nodes normal. Neurologic: Alert and oriented X 3, normal strength and tone. Normal symmetric reflexes. Normal coordination and gait    Assessment:    Healthy female exam.      Plan:     Anticipatory guidance given including wearing seatbelts, smoke detectors in  the home, increasing physical activity, increasing p.o. intake of water and vegetables. -will obtain labs -declines STI testing -given handout -next CPE in 1 yr See After Visit Summary for Counseling Recommendations    HTN -controlled -continue norvasc 10 mg dialy  HLD -Will obtain lipid panel -Will refill simvastatin 10 mg daily if needed  Obesity -BMI 38 -Discussed increasing physical activity, reducing portion sizes, and increasing feelings -Will continue to monitor  Fatigue -We will obtain labs -Increasing physical activity, reducing stress, increasing p.o. intake of water and vegetables.  Follow-up.  Grier Mitts, MD

## 2019-02-03 ENCOUNTER — Other Ambulatory Visit: Payer: Self-pay | Admitting: Family Medicine

## 2019-02-03 DIAGNOSIS — E559 Vitamin D deficiency, unspecified: Secondary | ICD-10-CM

## 2019-02-03 MED ORDER — VITAMIN D (ERGOCALCIFEROL) 1.25 MG (50000 UNIT) PO CAPS: 50000.0000 [IU] | ORAL_CAPSULE | ORAL | 0 refills | Status: DC

## 2019-02-09 ENCOUNTER — Other Ambulatory Visit: Payer: Self-pay | Admitting: Family Medicine

## 2019-02-19 ENCOUNTER — Ambulatory Visit: Payer: Self-pay | Admitting: Family Medicine

## 2019-02-19 NOTE — Telephone Encounter (Signed)
Pt called in and was lab results from Dr. Volanda Napoleon dated 02/03/2019 at 12:56PM.  Also the note from 02/03/2019 at 12:58 PM.  No result note found.   Pt is wondering why her platelets are high?  Can she do anything about this?  She is going to get her cholesterol numbers better with diet and exercise before increasing her medication.   She verbalized understanding of the vitamin D and how to take it.   I have sent this to Dr Volanda Napoleon.

## 2019-02-24 NOTE — Telephone Encounter (Signed)
Please advise 

## 2019-03-01 ENCOUNTER — Ambulatory Visit: Payer: Managed Care, Other (non HMO) | Admitting: Family Medicine

## 2019-03-01 ENCOUNTER — Ambulatory Visit: Payer: Self-pay

## 2019-03-01 NOTE — Telephone Encounter (Signed)
Pt aware that she needs to go to UC to be evaluated. Per Dr Volanda Napoleon, may need to be listened to and a CXR. Pt states that she does not want to go bc she does not want to get sicker than she she already is.   Pt declined UC after several attempts by [facility] triage.   Will send to Dr Volanda Napoleon as Juluis Rainier and for further recommendations. Pt requesting OTC treatment options.

## 2019-03-01 NOTE — Telephone Encounter (Signed)
Pt strongly advised to be evaluated as she stated she felt like she did when she had pneumonia.  Unable to listen to pt's lungs via e-visit.

## 2019-03-01 NOTE — Telephone Encounter (Signed)
Pt. called to report one week hx of low grade fever; reported temperature 99.1-100.5; most recent temp. taken at 8:00 PM, 4/12 was 99.1. c/o occasional cough.  Reported cough has gotten deeper and sounds more loose; coughing up cloudy mucus on occas.  C/o feeling tired ; stated it has been up and down over last week with feeling bad.  C/o intermittent back pain that feels  similar to what she felt when she had pneumonia / flu in January.  C/o loss of taste and smell.  Also reported she feels a little short of breath with walking.  Call placed to [facility]; patient transferred to [facility] for scheduling.     Reason for Disposition . MILD difficulty breathing (e.g., minimal/no SOB at rest, SOB with walking, pulse <100)  Answer Assessment - Initial Assessment Questions 1. COVID-19 DIAGNOSIS: "Who made your Coronavirus (COVID-19) diagnosis?" "Was it confirmed by a positive lab test?" If not diagnosed by a HCP, ask "Are there lots of cases (community spread) where you live?" (See public health department website, if unsure)   * MAJOR community spread: high number of cases; numbers of cases are increasing; many people hospitalized.   * MINOR community spread: low number of cases; not increasing; few or no people hospitalized     minor 2. ONSET: "When did the COVID-19 symptoms start?"      About one week- fever off and on 3. WORST SYMPTOM: "What is your worst symptom?" (e.g., cough, fever, shortness of breath, muscle aches)     Fatigue 4. COUGH: "How bad is the cough?"       Occasional cough that has become deeper and more loose; cloudy milky color  5. FEVER: "Do you have a fever?" If so, ask: "What is your temperature, how was it measured, and when did it start?"     Temperature has ranged 99.5-100.5; the most recent was 8:00 PM 4/12; 99.1 6. RESPIRATORY STATUS: "Describe your breathing?" (e.g., shortness of breath, wheezing, unable to speak)      Some shortness of breath when walking 7. BETTER-SAME-WORSE: "Are  you getting better, staying the same or getting worse compared to yesterday?"  If getting worse, ask, "In what way?"     Intermittently feels bad; has been up and down throughout the week.  8. HIGH RISK DISEASE: "Do you have any chronic medical problems?" (e.g., asthma, heart or lung disease, weak immune system, etc.)     Denied heart and lung problems; had pneumonia and flu late January 9. PREGNANCY: "Is there any chance you are pregnant?" "When was your last menstrual period?"     Had hysterectomy 10. OTHER SYMPTOMS: "Do you have any other symptoms?"  (e.g., runny nose, headache, sore throat, loss of smell)       Loss of smell and taste; intermittent back pain (comparable to previous pneumonia)  scratchy throat  Protocols used: CORONAVIRUS (COVID-19) DIAGNOSED OR SUSPECTED-A-AH

## 2019-03-01 NOTE — Telephone Encounter (Signed)
Please advise 

## 2019-03-09 NOTE — Telephone Encounter (Signed)
Your platelets are actually improving from previous labs.  They were likely high in the past due to your anemia.  Since your anemia has improved so have your platelets.  We will continue to monitor them.

## 2019-03-11 NOTE — Telephone Encounter (Signed)
FYI: Spoke with pt verbalized understanding of  Dr Volanda Napoleon advice, pt also wanted to report that her daughter who lives in [place] was diagnosed with COVID-19, Pt states that she was never tested since she live in [place] and there was no testing done at the time she was sick,pt states that they both are doing well.

## 2019-03-11 NOTE — Telephone Encounter (Signed)
That's good to know.

## 2019-05-01 ENCOUNTER — Other Ambulatory Visit: Payer: Self-pay | Admitting: Family Medicine

## 2019-05-01 DIAGNOSIS — E559 Vitamin D deficiency, unspecified: Secondary | ICD-10-CM

## 2019-07-20 ENCOUNTER — Telehealth (INDEPENDENT_AMBULATORY_CARE_PROVIDER_SITE_OTHER): Payer: Managed Care, Other (non HMO) | Admitting: Family Medicine

## 2019-07-20 ENCOUNTER — Other Ambulatory Visit: Payer: Self-pay

## 2019-07-20 DIAGNOSIS — Z91018 Allergy to other foods: Secondary | ICD-10-CM | POA: Diagnosis not present

## 2019-07-20 DIAGNOSIS — R059 Cough, unspecified: Secondary | ICD-10-CM

## 2019-07-20 DIAGNOSIS — R05 Cough: Secondary | ICD-10-CM | POA: Diagnosis not present

## 2019-07-20 MED ORDER — CETIRIZINE HCL 10 MG PO TABS: 10.0000 mg | ORAL_TABLET | Freq: Every day | ORAL | 5 refills | Status: DC

## 2019-07-20 MED ORDER — ALBUTEROL SULFATE HFA 108 (90 BASE) MCG/ACT IN AERS: 2.0000 | INHALATION_SPRAY | Freq: Four times a day (QID) | RESPIRATORY_TRACT | 1 refills | Status: DC | PRN

## 2019-07-20 MED ORDER — EPINEPHRINE 0.3 MG/0.3ML IJ SOAJ: 0.3000 mg | INTRAMUSCULAR | 2 refills | Status: AC | PRN

## 2019-07-20 NOTE — Progress Notes (Signed)
Virtual Visit via Video Note Visit started via doxy video but switched to telephone near the end after video stopped 2/2 poor connection. I connected with Patricia Pineda on 07/20/19 at  1:30 PM EDT by a video enabled telemedicine application 2/2 [id number] pandemic and verified that I am speaking with the correct person using two identifiers.  Location patient: home Location provider:work or home office Persons participating in the virtual visit: patient, provider  I discussed the limitations of evaluation and management by telemedicine and the availability of in person appointments. The patient expressed understanding and agreed to proceed.   HPI: Pt notes a cough with a wheeze that occurs mostly at night x 1 month. Pt also noticed a soreness in her upper back near R shoulder.  Pt denies fever, rhinorrhea, ear pressure, sore throat.  No issues with seasonal allergies since being an adult.  Pt endorses having heart burn symptoms recently.  Cut down on fried foods.  Pt notes new allergies: shellfish.  Lobster mac and cheese now causes a cough/throat irritation.  Pt took Benadryl and symptoms resolved.  Pt will occassionally develop hives when if it is too cold in a room.  Pt had asthma as a teenager, smoke, exercise would trigger her symptoms.  Pt is walking every morning and running some for exercise.   ROS: See pertinent positives and negatives per HPI.  Past Medical History:  Diagnosis Date  . Anemia   . Arthritis    knees  . High cholesterol   . Hypertension   . Pneumonia     Past Surgical History:  Procedure Laterality Date  . ABDOMINAL HYSTERECTOMY  09/17/2016  . CESAREAN SECTION    . HYSTERECTOMY ABDOMINAL WITH SALPINGECTOMY Bilateral 09/17/2016   Procedure: HYSTERECTOMY ABDOMINAL WITH BILATERAL SALPINGECTOMY, LEFT OOPHORECTOMY;  Surgeon: Marylynn Pearson, MD;  Location: [facility];  Service: Gynecology;  Laterality: Bilateral;    Family History  Problem Relation Age of Onset  .  Hypertension Father   . Prostate cancer Father   . Heart attack Maternal Grandmother        early 1s  . Arthritis Paternal Grandmother   . Colon cancer Neg Hx   . Esophageal cancer Neg Hx   . Rectal cancer Neg Hx   . Stomach cancer Neg Hx      Current Outpatient Medications:  .  amLODipine (NORVASC) 10 MG tablet, TAKE ONE TABLET BY MOUTH DAILY, Disp: 90 tablet, Rfl: 2 .  meloxicam (MOBIC) 15 MG tablet, Take 15 mg by mouth as needed for pain., Disp: , Rfl:  .  simvastatin (ZOCOR) 10 MG tablet, TAKE 1 TABLET BY MOUTH 3 TIMES PER WEEK, Disp: 38 tablet, Rfl: 3 .  Vitamin D, Ergocalciferol, (DRISDOL) 1.25 MG (50000 UT) CAPS capsule, Take 1 capsule (50,000 Units total) by mouth every 7 (seven) days., Disp: 12 capsule, Rfl: 0  Current Facility-Administered Medications:  .  0.9 %  sodium chloride infusion, 500 mL, Intravenous, Once, Armbruster, Carlota Raspberry, MD  EXAM:  VITALS per patient if applicable: RR between 41-66 bpm  GENERAL: alert, oriented, appears well and in no acute distress  HEENT: atraumatic, conjunctiva clear, no obvious abnormalities on inspection of external nose and ears  NECK: normal movements of the head and neck  LUNGS: on inspection no signs of respiratory distress, breathing rate appears normal, no obvious gross SOB, gasping or wheezing  CV: no obvious cyanosis  MS: moves all visible extremities without noticeable abnormality  PSYCH/NEURO: pleasant and cooperative, no obvious depression or  anxiety, speech and thought processing grossly intact  ASSESSMENT AND PLAN:  Discussed the following assessment and plan:  Food allergy -advised to avoid shellfish  - Plan: EPINEPHrine 0.3 mg/0.3 mL IJ SOAJ injection  Cough  -discussed possible causes including allergies, GERD, asthma -given precautions for worsening symptoms. -keep a food diary. - Plan: cetirizine (ZYRTEC) 10 MG tablet, albuterol (VENTOLIN HFA) 108 (90 Base) MCG/ACT inhaler  F/u in 2-4 wks   I  discussed the assessment and treatment plan with the patient. The patient was provided an opportunity to ask questions and all were answered. The patient agreed with the plan and demonstrated an understanding of the instructions.   The patient was advised to call back or seek an in-person evaluation if the symptoms worsen or if the condition fails to improve as anticipated.  I provided 8 minutes of non-face-to-face time during this encounter via telephone after doxy video visit stopped 2/2 poor connection.   Billie Ruddy, MD

## 2019-07-29 ENCOUNTER — Encounter: Payer: Self-pay | Admitting: Family Medicine

## 2019-08-02 ENCOUNTER — Other Ambulatory Visit: Payer: Self-pay

## 2019-08-02 MED ORDER — OMEPRAZOLE 20 MG PO CPDR: 20.0000 mg | DELAYED_RELEASE_CAPSULE | Freq: Every day | ORAL | 3 refills | Status: DC

## 2019-08-10 ENCOUNTER — Encounter: Payer: Self-pay | Admitting: Family Medicine

## 2019-08-13 ENCOUNTER — Telehealth (INDEPENDENT_AMBULATORY_CARE_PROVIDER_SITE_OTHER): Payer: Managed Care, Other (non HMO) | Admitting: Family Medicine

## 2019-08-13 ENCOUNTER — Other Ambulatory Visit: Payer: Self-pay

## 2019-08-13 DIAGNOSIS — R059 Cough, unspecified: Secondary | ICD-10-CM

## 2019-08-13 DIAGNOSIS — Z8709 Personal history of other diseases of the respiratory system: Secondary | ICD-10-CM

## 2019-08-13 DIAGNOSIS — R05 Cough: Secondary | ICD-10-CM

## 2019-08-13 MED ORDER — MONTELUKAST SODIUM 10 MG PO TABS: 10.0000 mg | ORAL_TABLET | Freq: Every day | ORAL | 3 refills | Status: DC

## 2019-08-13 NOTE — Progress Notes (Signed)
Virtual Visit via Video Note  I connected with Lynelle Doctor on 08/13/19 at 11:30 AM EDT by a video enabled telemedicine application 2/2 [id number] pandemic and verified that I am speaking with the correct person using two identifiers.  Location patient: home Location provider:work or home office Persons participating in the virtual visit: patient, provider  I discussed the limitations of evaluation and management by telemedicine and the availability of in person appointments. The patient expressed understanding and agreed to proceed.   HPI: Dry cough and wheezing x 2 months.  Using albuterol and zyrtec but not sure it is helping.  Pt tried mucinex which helped the next day. Pt notes a continued "heavy feeling in her back" and the need to clear her throat.  Pt felt ok during a wknd trip to the mountains, but notes back soreness and SOB when attempting to take a walk in her neighborhood.  Pt denies heartburn, hoarse voice, fever, chills, n/v, diarrhea, rhinorrhea.  Pt notes h/o pneumonia in the past.   ROS: See pertinent positives and negatives per HPI.  Past Medical History:  Diagnosis Date  . Anemia   . Arthritis    knees  . High cholesterol   . Hypertension   . Pneumonia     Past Surgical History:  Procedure Laterality Date  . ABDOMINAL HYSTERECTOMY  09/17/2016  . CESAREAN SECTION    . HYSTERECTOMY ABDOMINAL WITH SALPINGECTOMY Bilateral 09/17/2016   Procedure: HYSTERECTOMY ABDOMINAL WITH BILATERAL SALPINGECTOMY, LEFT OOPHORECTOMY;  Surgeon: Marylynn Pearson, MD;  Location: [facility];  Service: Gynecology;  Laterality: Bilateral;    Family History  Problem Relation Age of Onset  . Hypertension Father   . Prostate cancer Father   . Heart attack Maternal Grandmother        early 60s  . Arthritis Paternal Grandmother   . Colon cancer Neg Hx   . Esophageal cancer Neg Hx   . Rectal cancer Neg Hx   . Stomach cancer Neg Hx      Current Outpatient Medications:  .  albuterol  (VENTOLIN HFA) 108 (90 Base) MCG/ACT inhaler, Inhale 2 puffs into the lungs every 6 (six) hours as needed for wheezing or shortness of breath., Disp: 8 g, Rfl: 1 .  amLODipine (NORVASC) 10 MG tablet, TAKE ONE TABLET BY MOUTH DAILY, Disp: 90 tablet, Rfl: 2 .  cetirizine (ZYRTEC) 10 MG tablet, Take 1 tablet (10 mg total) by mouth daily., Disp: 30 tablet, Rfl: 5 .  EPINEPHrine 0.3 mg/0.3 mL IJ SOAJ injection, Inject 0.3 mLs (0.3 mg total) into the muscle as needed for anaphylaxis., Disp: 2 each, Rfl: 2 .  meloxicam (MOBIC) 15 MG tablet, Take 15 mg by mouth as needed for pain., Disp: , Rfl:  .  omeprazole (PRILOSEC) 20 MG capsule, Take 1 capsule (20 mg total) by mouth daily., Disp: 30 capsule, Rfl: 3 .  simvastatin (ZOCOR) 10 MG tablet, TAKE 1 TABLET BY MOUTH 3 TIMES PER WEEK, Disp: 38 tablet, Rfl: 3 .  Vitamin D, Ergocalciferol, (DRISDOL) 1.25 MG (50000 UT) CAPS capsule, Take 1 capsule (50,000 Units total) by mouth every 7 (seven) days., Disp: 12 capsule, Rfl: 0  Current Facility-Administered Medications:  .  0.9 %  sodium chloride infusion, 500 mL, Intravenous, Once, Armbruster, Carlota Raspberry, MD  EXAM:  VITALS per patient if applicable:  GENERAL: alert, oriented, appears well and in no acute distress  HEENT: atraumatic, conjunctiva clear, no obvious abnormalities on inspection of external nose and ears  NECK: normal movements of  the head and neck  LUNGS: on inspection no signs of respiratory distress, breathing rate appears normal, no obvious gross SOB, gasping or wheezing  CV: no obvious cyanosis  MS: moves all visible extremities without noticeable abnormality  PSYCH/NEURO: pleasant and cooperative, no obvious depression or anxiety, speech and thought processing grossly intact  ASSESSMENT AND PLAN:  Discussed the following assessment and plan:  Cough  -discussed possible causes including silent reflux-though tried prilosec, allergies, asthma, bacterial infection, medications (not on an  ACE-I). -will obtain CXR given duration of cough. -will d/c zyrtec.  Start singulair. -consider PFTs - Plan: DG Chest 2 View, montelukast (SINGULAIR) 10 MG tablet  History of asthma -symptoms as a child  -continue albuterol inhaler prn - Plan: montelukast (SINGULAIR) 10 MG tablet -consider PFTs  F/u prn in the next wk   I discussed the assessment and treatment plan with the patient. The patient was provided an opportunity to ask questions and all were answered. The patient agreed with the plan and demonstrated an understanding of the instructions.   The patient was advised to call back or seek an in-person evaluation if the symptoms worsen or if the condition fails to improve as anticipated.   Billie Ruddy, MD

## 2019-08-16 ENCOUNTER — Other Ambulatory Visit: Payer: Self-pay

## 2019-08-16 ENCOUNTER — Ambulatory Visit (INDEPENDENT_AMBULATORY_CARE_PROVIDER_SITE_OTHER)
Admission: RE | Admit: 2019-08-16 | Discharge: 2019-08-16 | Disposition: A | Discharge status: Home / Self Care | Payer: Managed Care, Other (non HMO) | Source: Ambulatory Visit | Attending: Family Medicine | Admitting: Family Medicine

## 2019-08-16 DIAGNOSIS — R05 Cough: Secondary | ICD-10-CM | POA: Diagnosis not present

## 2019-08-16 DIAGNOSIS — R059 Cough, unspecified: Secondary | ICD-10-CM

## 2019-08-17 ENCOUNTER — Other Ambulatory Visit: Payer: Self-pay | Admitting: Family Medicine

## 2019-08-17 ENCOUNTER — Encounter: Payer: Self-pay | Admitting: Family Medicine

## 2019-08-17 DIAGNOSIS — J4540 Moderate persistent asthma, uncomplicated: Secondary | ICD-10-CM

## 2019-08-17 DIAGNOSIS — I10 Essential (primary) hypertension: Secondary | ICD-10-CM

## 2019-08-17 MED ORDER — BECLOMETHASONE DIPROPIONATE 40 MCG/ACT IN AERS: 1.0000 | INHALATION_SPRAY | Freq: Two times a day (BID) | RESPIRATORY_TRACT | 12 refills | Status: DC

## 2019-08-27 NOTE — Telephone Encounter (Signed)
Pt called in upset because she has still not received any clear answers about her condition. I reviewed the basics of atelectasis and the treatments Dr. Volanda Napoleon has prescribed. I advised she should follow these close, explained maintenance inhaler use and rescue inhaler use. Also advised if she feels her condition is not improving we could ask for a referral to pulmonology for further evaluation. Pt voiced understanding.

## 2019-08-31 NOTE — Telephone Encounter (Signed)
Spoke with pt gave my sincere apologies for not calling to check if she had further questions regarding her Imaging results after view them on MyChart Portal. Pt state that she verbalized understanding of the imaging results. Pt was advised to call office next time she has an urgent question.

## 2019-08-31 NOTE — Telephone Encounter (Signed)
FYI. Noted

## 2019-09-22 ENCOUNTER — Telehealth: Payer: Self-pay | Admitting: Family Medicine

## 2019-09-22 ENCOUNTER — Telehealth: Payer: Self-pay

## 2019-09-22 NOTE — Telephone Encounter (Signed)
Ok to place referral.

## 2019-09-22 NOTE — Telephone Encounter (Signed)
ok 

## 2019-09-22 NOTE — Telephone Encounter (Signed)
Copied from [place] [id number]. Topic: Referral - Request for Referral >> Sep 22, 2019  8:42 AM Sheran Luz wrote: Has patient seen PCP for this complaint? Yes *If NO, is insurance requiring patient see PCP for this issue before PCP can refer them? Referral for which specialty: Pulmonology  Preferred provider/office: None specified  Reason for referral: Wheezing at night- states this has been addressed by PCP

## 2019-09-22 NOTE — Telephone Encounter (Signed)
Copied from [place] [id number]. Topic: Quick Communication - Other Results (Clinic Use ONLY) >> Aug 20, 2019  1:09 PM Scherrie Gerlach wrote: Pt would like a call back concerning her Xray and results. Pt did see on mychart, but would like to discuss what this all means

## 2019-09-23 ENCOUNTER — Other Ambulatory Visit: Payer: Self-pay

## 2019-09-23 DIAGNOSIS — R062 Wheezing: Secondary | ICD-10-CM

## 2019-09-23 NOTE — Telephone Encounter (Signed)
Referral placed.

## 2019-11-30 ENCOUNTER — Encounter: Payer: Self-pay | Admitting: Internal Medicine

## 2019-11-30 ENCOUNTER — Other Ambulatory Visit: Payer: Self-pay

## 2019-11-30 ENCOUNTER — Ambulatory Visit: Payer: Managed Care, Other (non HMO) | Admitting: Internal Medicine

## 2019-11-30 VITALS — BP 128/80 | HR 83 | Ht 66.0 in | Wt 249.0 lb

## 2019-11-30 DIAGNOSIS — E669 Obesity, unspecified: Secondary | ICD-10-CM

## 2019-11-30 DIAGNOSIS — J4540 Moderate persistent asthma, uncomplicated: Secondary | ICD-10-CM | POA: Diagnosis not present

## 2019-11-30 NOTE — Patient Instructions (Addendum)
Start taking Qvar twice a day. You need to gargle with this medication after every use.  Take albuterol as needed, and 15 minutes before exercise.  The patient should have follow up scheduled with myself in 3 months.   Prior to next visit patient should have a full set of PFTs including:  Spirometry with bronchodilator if obstructed FeNO     By learning about asthma and how it can be controlled, you take an important step toward managing this disease. Work closely with your asthma care team to learn all you can about your asthma, how to avoid triggers, what your medications do, and how to take them correctly. With proper care, you can live free of asthma symptoms and maintain a normal, healthy lifestyle.   What is asthma? Asthma is a chronic disease that affects the airways of the lungs. During normal breathing, the bands of muscle that surround the airways are relaxed and air moves freely. During an asthma episode or "attack," there are three main changes that stop air from moving easily through the airways:  The bands of muscle that surround the airways tighten and make the airways narrow. This tightening is called bronchospasm.   The lining of the airways becomes swollen or inflamed.   The cells that line the airways produce more mucus, which is thicker than normal and clogs the airways.  These three factors - bronchospasm, inflammation, and mucus production - cause symptoms such as difficulty breathing, wheezing, and coughing.  What are the most common symptoms of asthma? Asthma symptoms are not the same for everyone. They can even change from episode to episode in the same person. Also, you may have only one symptom of asthma, such as cough, but another person may have all the symptoms of asthma. It is important to know all the symptoms of asthma and to be aware that your asthma can present in any of these ways at any time. The most common symptoms include: . Coughing, especially at  night  . Shortness of breath  . Wheezing  . Chest tightness, pain, or pressure   Who is affected by asthma? Asthma affects 22 million Americans; about 6 million of these are children under age 43. People who have a family history of asthma have an increased risk of developing the disease. Asthma is also more common in people who have allergies or who are exposed to tobacco smoke. However, anyone can develop asthma at any time. Some people may have asthma all of their lives, while others may develop it as adults.  What causes asthma? The airways in a person with asthma are very sensitive and react to many things, or "triggers." Contact with these triggers causes asthma symptoms. One of the most important parts of asthma control is to identify your triggers and then avoid them when possible. The only trigger you do not want to avoid is exercise. Pre-treatment with medicines before exercise can allow you to stay active yet avoid asthma symptoms. Common asthma triggers include: 1. Infections (colds, viruses, flu, sinus infections)  2. Exercise  3. Weather (changes in temperature and/or humidity, cold air)  4. Tobacco smoke  5. Allergens (dust mites, pollens, pets, mold spores, cockroaches, and sometimes foods)  6. Irritants (strong odors from cleaning products, perfume, wood smoke, air pollution)  7. Strong emotions such as crying or laughing hard  8. Some medications   How is asthma diagnosed? To diagnose asthma, your doctor will first review your medical history, family history, and symptoms.  Your doctor will want to know any past history of breathing problems you may have had, as well as a family history of asthma, allergies, eczema (a bumpy, itchy skin rash caused by allergies), or other lung disease. It is important that you describe your symptoms in detail (cough, wheeze, shortness of breath, chest tightness), including when and how often they occur. The doctor will perform a physical  examination and listen to your heart and lungs. He or she may also order breathing tests, allergy tests, blood tests, and chest and sinus X-rays. The tests will find out if you do have asthma and if there are any other conditions that are contributing factors.  How is asthma treated? Asthma can be controlled, but not cured. It is not normal to have frequent symptoms, trouble sleeping, or trouble completing tasks. Appropriate asthma care will prevent symptoms and visits to the emergency room and hospital. Asthma medicines are one of the mainstays of asthma treatment. The drugs used to treat asthma are explained below.  Anti-inflammatories: These are the most important drugs for most people with asthma. Anti-inflammatory drugs reduce swelling and mucus production in the airways. As a result, airways are less sensitive and less likely to react to triggers. These medications need to be taken daily and may need to be taken for several weeks before they begin to control asthma. Anti-inflammatory medicines lead to fewer symptoms, better airflow, less sensitive airways, less airway damage, and fewer asthma attacks. If taken every day, they CONTROL or prevent asthma symptoms.   Bronchodilators: These drugs relax the muscle bands that tighten around the airways. This action opens the airways, letting more air in and out of the lungs and improving breathing. Bronchodilators also help clear mucus from the lungs. As the airways open, the mucus moves more freely and can be coughed out more easily. In short-acting forms, bronchodilators RELIEVE or stop asthma symptoms by quickly opening the airways and are very helpful during an asthma episode. In long-acting forms, bronchodilators provide CONTROL of asthma symptoms and prevent asthma episodes.  Asthma drugs can be taken in a variety of ways. Inhaling the medications by using a metered dose inhaler, dry powder inhaler, or nebulizer is one way of taking asthma medicines.  Oral medicines (pills or liquids you swallow) may also be prescribed.  Asthma severity Asthma is classified as either "intermittent" (comes and goes) or "persistent" (lasting). Persistent asthma is further described as being mild, moderate, or severe. The severity of asthma is based on how often you have symptoms both during the day and night, as well as by the results of lung function tests and by how well you can perform activities. The "severity" of asthma refers to how "intense" or "strong" your asthma is.  Asthma control Asthma control is the goal of asthma treatment. Regardless of your asthma severity, it may or may not be controlled. Asthma control means: . You are able to do everything you want to do at work and home  . You have no (or minimal) asthma symptoms  . You do not wake up from your sleep or earlier than usual in the morning due to asthma  . You rarely need to use your reliever medicine (inhaler)  Another major part of your treatment is that you are happy with your asthma care and believe your asthma is controlled.  Monitoring symptoms A key part of treatment is keeping track of how well your lungs are working. Monitoring your symptoms  what they are, how and  when they happen, and how severe they are  is an important part of being able to control your asthma.  Sometimes asthma is monitored using a peak flow meter. A peak flow (PF) meter measures how fast the air comes out of your lungs. It can help you know when your asthma is getting worse, sometimes even before you have symptoms. By taking daily peak flow readings, you can learn when to adjust medications to keep asthma under good control. It is also used to create your asthma action plan (see below). Your doctor can use your peak flow readings to adjust your treatment plan in some cases.  Asthma Action Plan Based on your history and asthma severity, you and your doctor will develop a care plan called an "asthma action  plan." The asthma action plan describes when and how to use your medicines, actions to take when asthma worsens, and when to seek emergency care. Make sure you understand this plan. If you do not, ask your asthma care provider any questions you may have. Your asthma action plan is one of the keys to controlling asthma. Keep it readily available to remind you of what you need to do every day to control asthma and what you need to do when symptoms occur.  Goals of asthma therapy These are the goals of asthma treatment: . Live an active, normal life  . Prevent chronic and troublesome symptoms  . Attend work or school every day  . Perform daily activities without difficulty  . Stop urgent visits to the doctor, emergency department, or hospital  . Use and adjust medications to control asthma with few or no side effects

## 2019-11-30 NOTE — Progress Notes (Signed)
Patricia Pineda    [id number]    [birth year]  Primary Care Physician:Banks, Langley Adie, MD  Referring Physician: Billie Ruddy, [place],  [place] [address] Reason for Consultation: wheezing Date of Consultation: 11/30/2019  Chief complaint:   Chief Complaint  Patient presents with  . Consult    Pt being referred due to having a lot of wheezing which she stated began about 6 months ago and also has SOB associated. Pt will also occ have a dry cough.     HPI:  In 2019 had influenza B.  Later that month was diagnosed with pneumonia. Thinks she had covid 19 in Jan-Feb 2020 with anosmia.   August 2020 started having wheezing, shortness of breath.  Now when she goes for a walk she has wheezing with exertion.  No cough. But does have chest and back tightness.  [place] with ADLs, but has cut her walking in half due to wheezing.   Albuterol - uses as needed 1-2 times/week. Not sure if it helps.   She asthma in childhood and had ED visits. Triggers - smoke, running(used to run track,) movement and exercise.   Ate lobster mac and cheese and had hives - thinks it is a shellfish allergy.   Current Regimen: albuterol prn Asthma Triggers: smoke, exercise Exacerbations in the last year: History of hospitalization or intubation: never Hives: yes, in cold weather, shellfish Allergy Testing: never GERD: occasionally Allergic Rhinitis:  none ACT: No flowsheet data found. FeNO:  Social history:  Occupation: Web designer.  Exposures: lives at home with husband and son Smoking history: never smoker.   Social History   Occupational History  . Occupation: Web designer  Tobacco Use  . Smoking status: Never Smoker  . Smokeless tobacco: Never Used  Substance and Sexual Activity  . Alcohol use: Yes    Alcohol/week: 1.0 standard drinks    Types: 1 Glasses of wine per week    Comment: occassionally  . Drug use: No  . Sexual activity:  Yes    Partners: Female    Birth control/protection: Surgical    Relevant family history:  Family History  Problem Relation Age of Onset  . Hypertension Father   . Prostate cancer Father   . Heart attack Maternal Grandmother        early 60s  . Arthritis Paternal Grandmother   . Asthma Child   . Colon cancer Neg Hx   . Esophageal cancer Neg Hx   . Rectal cancer Neg Hx   . Stomach cancer Neg Hx     Past Medical History:  Diagnosis Date  . Anemia   . Arthritis    knees  . High cholesterol   . Hypertension   . Pneumonia     Past Surgical History:  Procedure Laterality Date  . ABDOMINAL HYSTERECTOMY  09/17/2016  . CESAREAN SECTION    . HYSTERECTOMY ABDOMINAL WITH SALPINGECTOMY Bilateral 09/17/2016   Procedure: HYSTERECTOMY ABDOMINAL WITH BILATERAL SALPINGECTOMY, LEFT OOPHORECTOMY;  Surgeon: Marylynn Pearson, MD;  Location: [facility];  Service: Gynecology;  Laterality: Bilateral;     Review of systems: Review of Systems  Constitutional: Negative for chills, fever and weight loss.  HENT: Negative for congestion, sinus pain and sore throat.   Eyes: Negative for discharge and redness.  Respiratory: Positive for shortness of breath and wheezing. Negative for cough, hemoptysis and sputum production.   Cardiovascular: Negative for chest pain, palpitations and leg  swelling.  Gastrointestinal: Negative for heartburn, nausea and vomiting.  Musculoskeletal: Positive for back pain. Negative for joint pain and myalgias.  Skin: Negative for rash.  Neurological: Negative for dizziness, tremors, focal weakness and headaches.  Endo/Heme/Allergies: Negative for environmental allergies.  Psychiatric/Behavioral: Negative for depression. The patient is not nervous/anxious.   All other systems reviewed and are negative.   Physical Exam: Blood pressure 128/80, pulse 83, height _0  (1.676 m), weight 249 lb (112.9 kg), last menstrual period 08/17/2016, SpO2 100 %. Gen:      No acute  distress Eyes: EOMI, sclera anicteric ENT:  no nasal polyps, mucus membranes moist Neck:     Supple, no thyromegaly Lungs:    No increased respiratory effort, symmetric chest wall excursion, clear to auscultation bilaterally, no wheezes or crackles CV:         Regular rate and rhythm; no murmurs, rubs, or gallops.  No pedal edema Abd:      + bowel sounds; soft, non-tender; no distension MSK: no acute synovitis of DIP or PIP joints, no mechanics hands.  Skin:      Warm and dry; no rashes Neuro: normal speech, no focal facial asymmetry Psych: alert and oriented x3, normal mood and affect  Data Reviewed: Imaging: I have personally reviewed the chest xray 9/28 without pneumonia or acute cardiopulmonary process  PFTs: None on file  Labs:  Immunization status: Immunization History  Administered Date(s) Administered  . Influenza Whole 12/15/2012, 09/21/2013  . Influenza,inj,Quad PF,6+ Mos 10/30/2019  . Influenza-Unspecified 10/03/2016  . Tdap 03/18/2012    Assessment:  Moderate Persistent Asthma without exacerbation Obesity Body mass index is 40.19 kg/m.   Plan/Recommendations: Will have her do scheduled Qvar.  Continue prn albuterol Arlyce Harman and FeNO Discussed importance of weight loss in management of asthma. We talked about referral to nutrition to learn more about healthy eating habits.   I spent 45 minutes on 11/30/2019 in care of this patient including face to face time and non-face to face time spent charting, review of outside records, and coordination of care.   Return to Care: Return in about 3 months (around 02/28/2020).  [name], MD Pulmonary and [place]  CC: Billie Ruddy, MD

## 2020-01-05 IMAGING — DX DG CHEST 2V
2 series · 2 of 2 positions shown · non-contrast
Comparison: January 08, 2016

CLINICAL DATA: Cough and wheezing

EXAM:
CHEST - 2 VIEW

[chest pa]
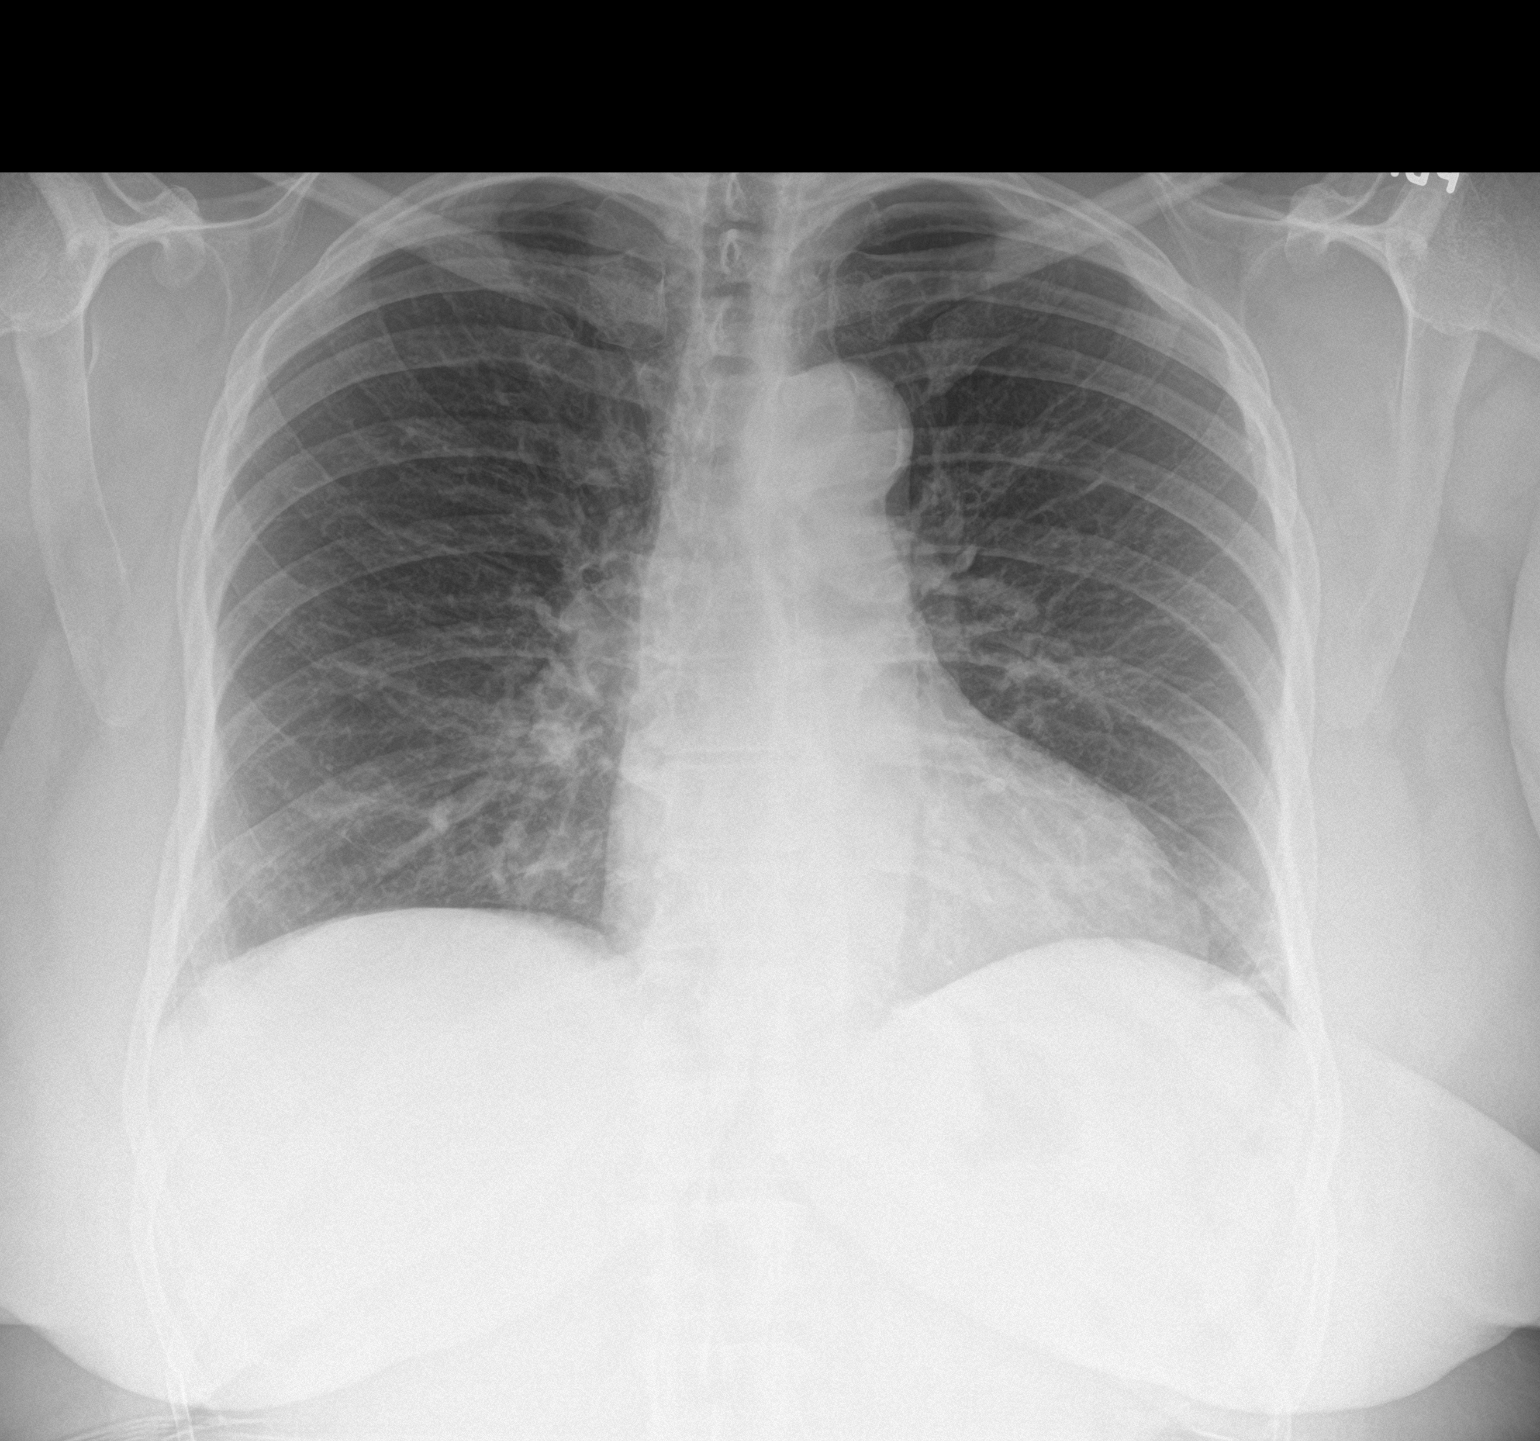

[chest lat]
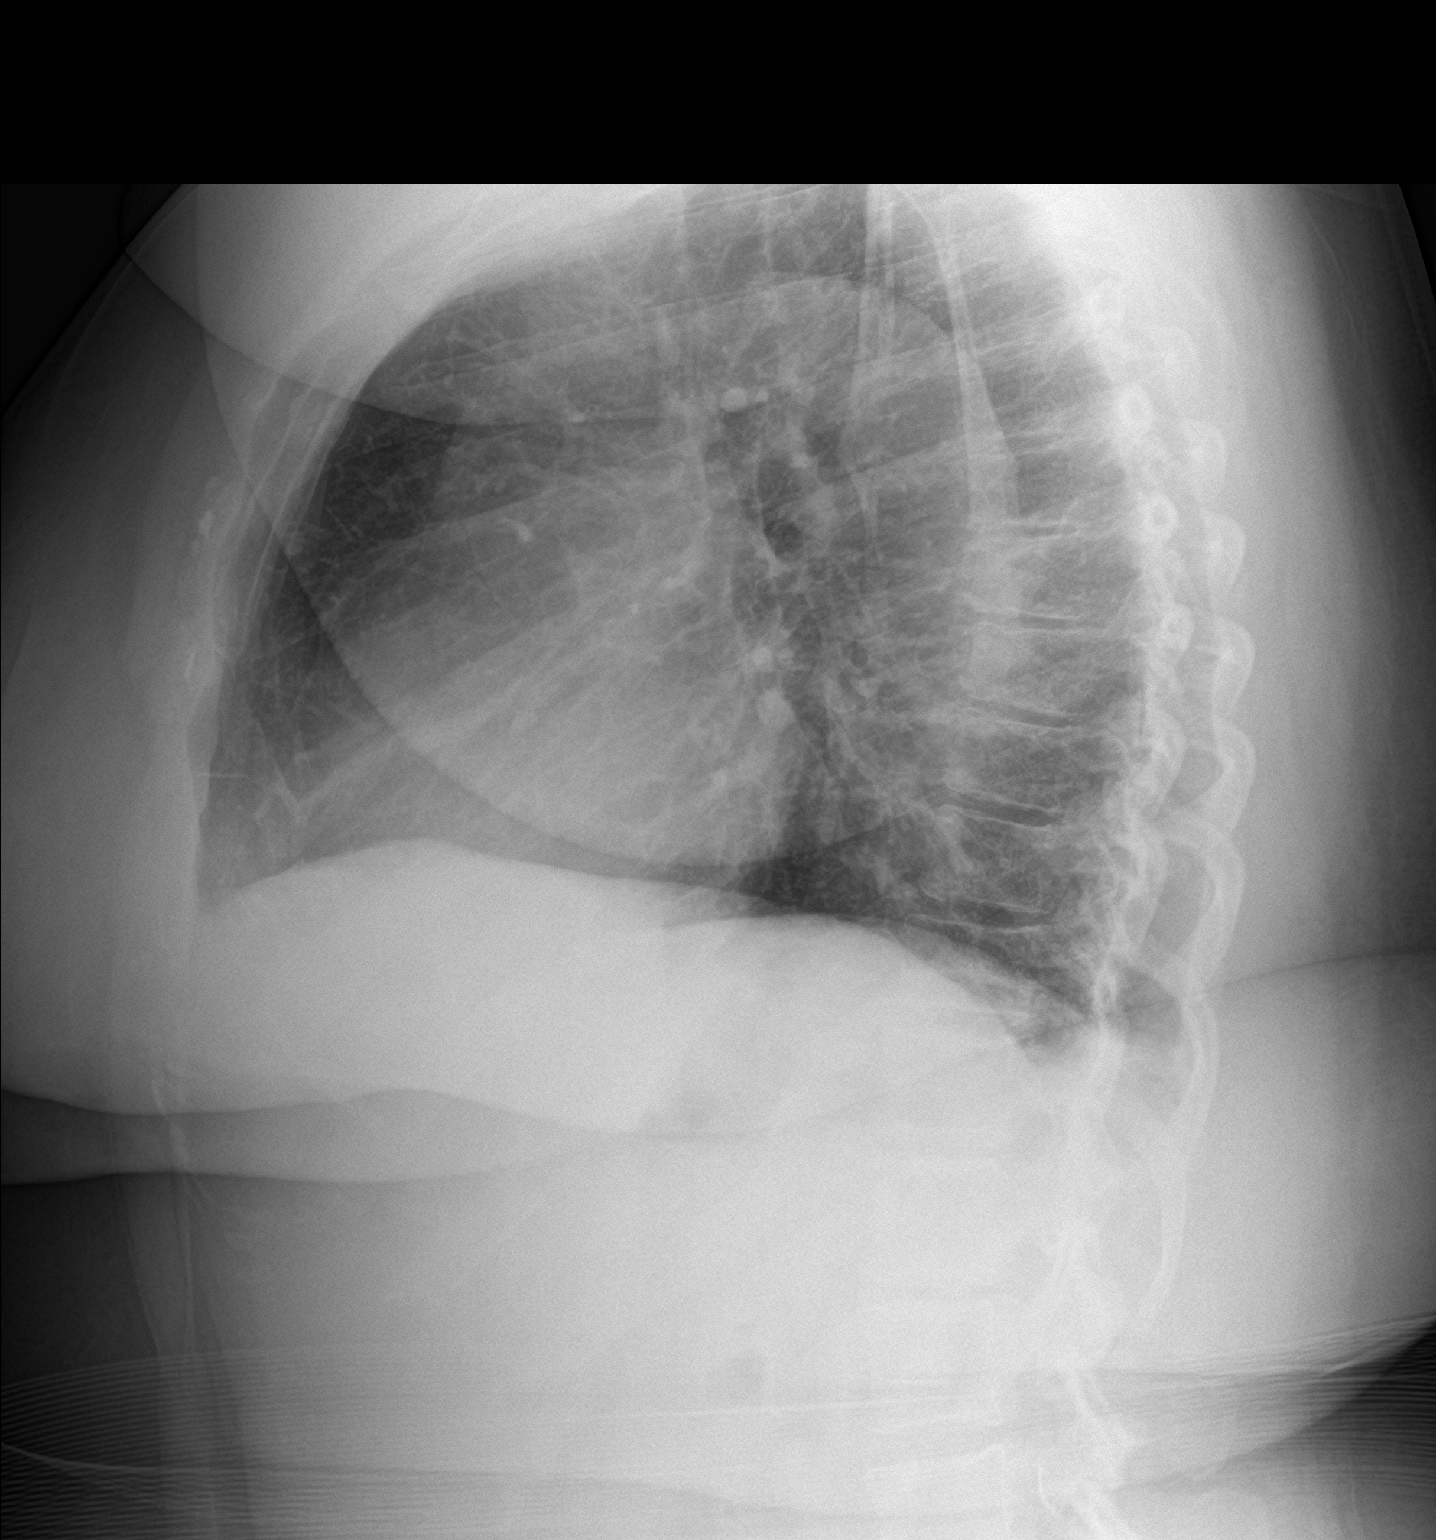

[2 of 2 positions shown; findings below may reference images not displayed]

FINDINGS: There is mild left base atelectasis. Lungs elsewhere clear. Heart
size and pulmonary vascularity are normal. No adenopathy. There is
aortic atherosclerosis. No bone lesions.
IMPRESSION: Mild left base atelectasis. No edema or consolidation. Heart size
within normal limits. Aortic Atherosclerosis ([id number].B).

## 2020-01-17 ENCOUNTER — Ambulatory Visit: Payer: Managed Care, Other (non HMO) | Attending: Internal Medicine

## 2020-01-17 DIAGNOSIS — Z23 Encounter for immunization: Secondary | ICD-10-CM

## 2020-01-17 NOTE — Progress Notes (Signed)
   Covid-19 Vaccination Clinic  Name:  Patricia Pineda    MRN: [id number] DOB: 11-21-1965  01/17/2020  [name] was observed post Covid-19 immunization for 15 minutes without incidence. She was provided with Vaccine Information Sheet and instruction to access the V-Safe system.   [name] was instructed to call 911 with any severe reactions post vaccine: Marland Kitchen Difficulty breathing  . Swelling of your face and throat  . A fast heartbeat  . A bad rash all over your body  . Dizziness and weakness    Immunizations Administered    Name Date Dose VIS Date Route   Pfizer COVID-19 Vaccine 01/17/2020  2:09 PM 0.3 mL 10/29/2019 Intramuscular   Manufacturer: [place]   Lot: [id number]   [place]: [id number]

## 2020-02-03 ENCOUNTER — Ambulatory Visit (INDEPENDENT_AMBULATORY_CARE_PROVIDER_SITE_OTHER): Payer: Managed Care, Other (non HMO) | Admitting: Internal Medicine

## 2020-02-03 ENCOUNTER — Other Ambulatory Visit: Payer: Self-pay

## 2020-02-03 DIAGNOSIS — R053 Chronic cough: Secondary | ICD-10-CM

## 2020-02-03 DIAGNOSIS — R05 Cough: Secondary | ICD-10-CM

## 2020-02-03 MED ORDER — PANTOPRAZOLE SODIUM 40 MG PO TBEC: 40.0000 mg | DELAYED_RELEASE_TABLET | Freq: Every day | ORAL | 5 refills | Status: DC

## 2020-02-03 NOTE — Progress Notes (Signed)
Patricia Pineda    [id number]    02-11-1966  Primary Care Physician:Banks, Langley Adie, MD Date of Appointment: 02/03/2020 Established Patient Visit  Chief complaint:   Chief Complaint  Patient presents with  . Acute Visit    Reports having increased wheezing and chest tightness since her last OV in January.   I connected with  Lynelle Doctor on 02/03/20 by a video enabled telemedicine application and verified that I am speaking with the correct person using two identifiers.   I discussed the limitations of evaluation and management by telemedicine. The patient expressed understanding and agreed to proceed.   HPI:  Moderate persistent asthma started on Qvar in Jan 2021. Ordered spirometry and FeNO. Hasn't been done yet.   Interval Updates:  Sick visit today: shortness of breath occasionally. Wheezing is more at night time only - never up walking around during the day. Cough which is dry. Chest heaviness and tightness. Symptoms worse at night. Started about 10 days ago when she was standing outside in the yard talking to neighbors, symptoms got worse that nice. Taking Qvar BID. Albuterol took that night but it didn't resolve immediately - not sure it's working. Had stopped taking singulair but just resumed this and has helped the wheezing.   Was prescribed prilosec but didn't take consistently.   Current Regimen: albuterol prn, Qvar Asthma Triggers: smoke, exercise Exacerbations in the last year: History of hospitalization or intubation: never Hives: yes, in cold weather, shellfish Allergy Testing: never GERD: occasionally Allergic Rhinitis:  none ACT:  Asthma Control Test ACT Total Score  02/03/2020 14   FeNO:   I have reviewed the patient's family social and past medical history and updated as appropriate.   Past Medical History:  Diagnosis Date  . Anemia   . Arthritis    knees  . High cholesterol   . Hypertension   . Pneumonia     Past Surgical  History:  Procedure Laterality Date  . ABDOMINAL HYSTERECTOMY  09/17/2016  . CESAREAN SECTION    . HYSTERECTOMY ABDOMINAL WITH SALPINGECTOMY Bilateral 09/17/2016   Procedure: HYSTERECTOMY ABDOMINAL WITH BILATERAL SALPINGECTOMY, LEFT OOPHORECTOMY;  Surgeon: Marylynn Pearson, MD;  Location: [facility];  Service: Gynecology;  Laterality: Bilateral;    Family History  Problem Relation Age of Onset  . Hypertension Father   . Prostate cancer Father   . Heart attack Maternal Grandmother        early 55s  . Arthritis Paternal Grandmother   . Asthma Child   . Colon cancer Neg Hx   . Esophageal cancer Neg Hx   . Rectal cancer Neg Hx   . Stomach cancer Neg Hx     Social History   Occupational History  . Occupation: Web designer  Tobacco Use  . Smoking status: Never Smoker  . Smokeless tobacco: Never Used  Substance and Sexual Activity  . Alcohol use: Yes    Alcohol/week: 1.0 standard drinks    Types: 1 Glasses of wine per week    Comment: occassionally  . Drug use: No  . Sexual activity: Yes    Partners: Female    Birth control/protection: Surgical    Review of systems: Constitutional: No fevers, chills, night sweats, or weight loss. CV: No chest pain, or palpitations. Resp: No hemoptysis.  Physical Exam: Last menstrual period 08/17/2016. Not done  Data Reviewed: Imaging:   PFTs: pending  Labs:  Immunization status: Immunization History  Administered Date(s) Administered  .  Influenza Whole 12/15/2012, 09/21/2013  . Influenza,inj,Quad PF,6+ Mos 10/30/2019  . Influenza-Unspecified 10/03/2016  . PFIZER SARS-COV-2 Vaccination 01/17/2020  . Tdap 03/18/2012    Assessment:  Moderate Persistent Asthma Chronic Cough - suspicious for GERD  Plan/Recommendations: Continue Qvar, prn albuterol Proceed with Spirometry and FENO as scheduled as well as follow up with me. Will prescribe scheduled PPI to treat GERD, see if she has any benefit.   I spent 22  minutes on 02/03/2020 in care of this patient includingon-face to face time spent charting, review of outside records, and coordination of care.   Return to Care: As scheduled.   [name], MD [facility]

## 2020-02-09 ENCOUNTER — Ambulatory Visit: Payer: Managed Care, Other (non HMO) | Attending: Internal Medicine

## 2020-02-09 DIAGNOSIS — Z23 Encounter for immunization: Secondary | ICD-10-CM

## 2020-02-09 NOTE — Progress Notes (Signed)
   Covid-19 Vaccination Clinic  Name:  Patricia Pineda    MRN: [id number] DOB: September 17, 1966  02/09/2020  Patricia Pineda was observed post Covid-19 immunization for 15 minutes without incident. She was provided with Vaccine Information Sheet and instruction to access the V-Safe system.   Patricia Pineda was instructed to call 911 with any severe reactions post vaccine: Marland Kitchen Difficulty breathing  . Swelling of face and throat  . A fast heartbeat  . A bad rash all over body  . Dizziness and weakness   Immunizations Administered    Name Date Dose VIS Date Route   Pfizer COVID-19 Vaccine 02/09/2020  2:14 PM 0.3 mL 10/29/2019 Intramuscular   Manufacturer: [place]   Lot: [id number]   [place]: [id number]

## 2020-02-22 ENCOUNTER — Other Ambulatory Visit: Payer: Self-pay | Admitting: Family Medicine

## 2020-02-22 DIAGNOSIS — I10 Essential (primary) hypertension: Secondary | ICD-10-CM

## 2020-02-28 ENCOUNTER — Other Ambulatory Visit (HOSPITAL_COMMUNITY)
Admission: RE | Admit: 2020-02-28 | Discharge: 2020-02-28 | Disposition: A | Discharge status: Home / Self Care | Payer: Managed Care, Other (non HMO) | Source: Ambulatory Visit | Attending: Internal Medicine | Admitting: Internal Medicine

## 2020-02-28 DIAGNOSIS — Z20822 Contact with and (suspected) exposure to covid-19: Secondary | ICD-10-CM | POA: Diagnosis not present

## 2020-02-28 DIAGNOSIS — Z01812 Encounter for preprocedural laboratory examination: Secondary | ICD-10-CM | POA: Insufficient documentation

## 2020-02-28 LAB — SARS CORONAVIRUS 2 (TAT 6-24 HRS): SARS Coronavirus 2: NEGATIVE

## 2020-03-02 ENCOUNTER — Ambulatory Visit: Payer: Managed Care, Other (non HMO) | Admitting: Internal Medicine

## 2020-03-02 ENCOUNTER — Ambulatory Visit: Payer: Managed Care, Other (non HMO)

## 2020-03-02 ENCOUNTER — Encounter: Payer: Self-pay | Admitting: Internal Medicine

## 2020-03-02 ENCOUNTER — Other Ambulatory Visit: Payer: Self-pay

## 2020-03-02 VITALS — BP 140/86 | HR 83 | Temp 97.9°F | Ht 66.0 in | Wt 245.4 lb

## 2020-03-02 DIAGNOSIS — K219 Gastro-esophageal reflux disease without esophagitis: Secondary | ICD-10-CM

## 2020-03-02 DIAGNOSIS — R05 Cough: Secondary | ICD-10-CM | POA: Diagnosis not present

## 2020-03-02 DIAGNOSIS — J4540 Moderate persistent asthma, uncomplicated: Secondary | ICD-10-CM

## 2020-03-02 DIAGNOSIS — R053 Chronic cough: Secondary | ICD-10-CM

## 2020-03-02 LAB — POCT EXHALED NITRIC OXIDE: FeNO level (ppb): 72

## 2020-03-02 NOTE — Progress Notes (Signed)
Patricia Pineda    [id number]    10/06/1966  Primary Care Physician:Banks, Langley Adie, MD Date of Appointment: 03/02/2020 Established Patient Visit  Chief complaint:   Chief Complaint  Patient presents with  . Follow-up    chronic cough improved.  Asthma really good.  Wheezing at night at times.    HPI: Moderate persistent asthma started on Qvar in Jan 2021  Interval Updates: Got both covid vaccines already, feeling much better.   Current Regimen: albuterol prn, Qvar, singulair Asthma Triggers: smoke, exercise Exacerbations in the last year: none History of hospitalization or intubation: never Hives: yes, in cold weather, shellfish Allergy Testing: never GERD: yes on PPI.  Allergic Rhinitis:  none ACT:  Asthma Control Test ACT Total Score  02/03/2020 14    I have reviewed the patient's family social and past medical history and updated as appropriate.   Past Medical History:  Diagnosis Date  . Anemia   . Arthritis    knees  . High cholesterol   . Hypertension   . Pneumonia     Past Surgical History:  Procedure Laterality Date  . ABDOMINAL HYSTERECTOMY  09/17/2016  . CESAREAN SECTION    . HYSTERECTOMY ABDOMINAL WITH SALPINGECTOMY Bilateral 09/17/2016   Procedure: HYSTERECTOMY ABDOMINAL WITH BILATERAL SALPINGECTOMY, LEFT OOPHORECTOMY;  Surgeon: Marylynn Pearson, MD;  Location: [facility];  Service: Gynecology;  Laterality: Bilateral;    Family History  Problem Relation Age of Onset  . Hypertension Father   . Prostate cancer Father   . Heart attack Maternal Grandmother        early 40s  . Arthritis Paternal Grandmother   . Asthma Child   . Colon cancer Neg Hx   . Esophageal cancer Neg Hx   . Rectal cancer Neg Hx   . Stomach cancer Neg Hx     Social History   Occupational History  . Occupation: Web designer  Tobacco Use  . Smoking status: Never Smoker  . Smokeless tobacco: Never Used  Substance and Sexual Activity  . Alcohol use:  Yes    Alcohol/week: 1.0 standard drinks    Types: 1 Glasses of wine per week    Comment: occassionally  . Drug use: No  . Sexual activity: Yes    Partners: Female    Birth control/protection: Surgical    Review of systems: Constitutional: No fevers, chills, night sweats, or weight loss. CV: No chest pain, or palpitations. Resp: No hemoptysis.  Physical Exam: Blood pressure 140/86, pulse 83, temperature 97.9 F (36.6 C), temperature source Temporal, height 5\' 6"  (1.676 m), weight 245 lb 6.4 oz (111.3 kg), last menstrual period 08/17/2016, SpO2 97 %.  Gen: no distress, no increased work of breathing CV: RRR no mrg Res: ctab, no wheezes or crackles  Data Reviewed: Imaging: Chest xray 07/2019 with no acute cardiopulmonary abnormalities.   PFTs: Performed today. Spirometry shows no airflow limitation. No BD response.  Labs:  Immunization status: Immunization History  Administered Date(s) Administered  . Influenza Whole 12/15/2012, 09/21/2013  . Influenza,inj,Quad PF,6+ Mos 10/30/2019  . Influenza-Unspecified 10/03/2016  . PFIZER SARS-COV-2 Vaccination 01/17/2020, 02/09/2020  . Tdap 03/18/2012    Assessment:  Mild persistent Asthma, well controlled GERD, improved control Rhinitis, controlled  Plan/Recommendations: Continue Qvar, prn albuterol, singulair.  Continue PPI for GERD   Return to Care: Return in about 6 months (around 09/01/2020).  [name], MD Pulmonary and [place]

## 2020-03-02 NOTE — Patient Instructions (Signed)
The patient should have follow up scheduled with myself in 6 months.   What is GERD? Gastroesophageal reflux disease (GERD) is gastroesophageal reflux diseasewhich occurs when the lower esophageal sphincter (LES) opens spontaneously, for varying periods of time, or does not close properly and stomach contents rise up into the esophagus. GER is also called acid reflux or acid regurgitation, because digestive juices--called acids--rise up with the food. The esophagus is the tube that carries food from the mouth to the stomach. The LES is a ring of muscle at the bottom of the esophagus that acts like a valve between the esophagus and stomach.  When acid reflux occurs, food or fluid can be tasted in the back of the mouth. When refluxed stomach acid touches the lining of the esophagus it may cause a burning sensation in the chest or throat called heartburn or acid indigestion. Occasional reflux is common. Persistent reflux that occurs more than twice a week is considered GERD, and it can eventually lead to more serious health problems. People of all ages can have GERD. Studies have shown that GERD may worsen or contribute to asthma, chronic cough, and pulmonary fibrosis.   What are the symptoms of GERD? The main symptom of GERD in adults is frequent heartburn, also called acid indigestion--burning-type pain in the lower part of the mid-chest, behind the breast bone, and in the mid-abdomen.  Not all reflux is acidic in nature, and many patients don't have heart burn at all. Sometimes it feels like a cough (either dry or with mucus), choking sensation, asthma, shortness of breath, waking up at night, frequent throat clearing, or trouble swallowing.    What causes GERD? The reason some people develop GERD is still unclear. However, research shows that in people with GERD, the LES relaxes while the rest of the esophagus is working. Anatomical abnormalities such as a hiatal hernia may also contribute to GERD. A  hiatal hernia occurs when the upper part of the stomach and the LES move above the diaphragm, the muscle wall that separates the stomach from the chest. Normally, the diaphragm helps the LES keep acid from rising up into the esophagus. When a hiatal hernia is present, acid reflux can occur more easily. A hiatal hernia can occur in people of any age and is most often a normal finding in otherwise healthy people over age 50. Most of the time, a hiatal hernia produces no symptoms.   Other factors that may contribute to GERD include - Obesity or recent weight gain - Pregnancy  - Smoking  - Diet - Certain medications  Common foods that can worsen reflux symptoms include: - carbonated beverages - artificial sweeteners - citrus fruits  - chocolate  - drinks with caffeine or alcohol  - fatty and fried foods  - garlic and onions  - mint flavorings  - spicy foods  - tomato-based foods, like spaghetti sauce, salsa, chili, and pizza   Lifestyle Changes If you smoke, stop.  Avoid foods and beverages that worsen symptoms (see above.) Lose weight if needed.  Eat small, frequent meals.  Wear loose-fitting clothes.  Avoid lying down for 3 hours after a meal.  Raise the head of your bed 6 to 8 inches by securing wood blocks under the bedposts. Just using extra pillows will not help, but using a wedge-shaped pillow may be helpful.  Medications  H2 blockers, such as cimetidine (Tagamet HB), famotidine (Pepcid AC), nizatidine (Axid AR), and ranitidine (Zantac 75), decrease acid production. They are available   in prescription strength and over-the-counter strength. These drugs provide short-term relief and are effective for about half of those who have GERD symptoms.  Proton pump inhibitors include omeprazole (Prilosec, Zegerid), lansoprazole (Prevacid), pantoprazole (Protonix), rabeprazole (Aciphex), and esomeprazole (Nexium), which are available by prescription. Prilosec is also available in  over-the-counter strength. Proton pump inhibitors are more effective than H2 blockers and can relieve symptoms and heal the esophageal lining in almost everyone who has GERD.  Because drugs work in different ways, combinations of medications may help control symptoms. People who get heartburn after eating may take both antacids and H2 blockers. The antacids work first to neutralize the acid in the stomach, and then the H2 blockers act on acid production. By the time the antacid stops working, the H2 blocker will have stopped acid production. Your health care provider is the best source of information about how to use medications for GERD.   Points to Remember 1. You can have GERD without having heartburn. Your symptoms could include a dry cough, asthma symptoms, or trouble swallowing.  2. Taking medications daily as prescribed is important in controlling you symptoms.  Sometimes it can take up to 8 weeks to fully achieve the effects of the medications prescribed.  3. Coughing related to GERD can be difficult to treat and is very frustrating!  However, it is important to stick with these medications and lifestyle modifications before pursuing more aggressive or invasive test and treatments.   

## 2020-03-12 ENCOUNTER — Other Ambulatory Visit: Payer: Self-pay | Admitting: Family Medicine

## 2020-03-12 DIAGNOSIS — R05 Cough: Secondary | ICD-10-CM

## 2020-03-12 DIAGNOSIS — Z8709 Personal history of other diseases of the respiratory system: Secondary | ICD-10-CM

## 2020-03-12 DIAGNOSIS — R059 Cough, unspecified: Secondary | ICD-10-CM

## 2020-04-20 ENCOUNTER — Other Ambulatory Visit: Payer: Self-pay

## 2020-04-21 ENCOUNTER — Encounter: Payer: Self-pay | Admitting: Family Medicine

## 2020-04-21 ENCOUNTER — Ambulatory Visit (INDEPENDENT_AMBULATORY_CARE_PROVIDER_SITE_OTHER): Payer: Managed Care, Other (non HMO) | Admitting: Family Medicine

## 2020-04-21 VITALS — BP 128/78 | HR 78 | Temp 98.0°F | Wt 241.0 lb

## 2020-04-21 DIAGNOSIS — E782 Mixed hyperlipidemia: Secondary | ICD-10-CM

## 2020-04-21 DIAGNOSIS — I10 Essential (primary) hypertension: Secondary | ICD-10-CM | POA: Diagnosis not present

## 2020-04-21 DIAGNOSIS — R202 Paresthesia of skin: Secondary | ICD-10-CM | POA: Diagnosis not present

## 2020-04-21 DIAGNOSIS — Z0000 Encounter for general adult medical examination without abnormal findings: Secondary | ICD-10-CM

## 2020-04-21 DIAGNOSIS — R200 Anesthesia of skin: Secondary | ICD-10-CM

## 2020-04-21 DIAGNOSIS — E6609 Other obesity due to excess calories: Secondary | ICD-10-CM | POA: Diagnosis not present

## 2020-04-21 DIAGNOSIS — R600 Localized edema: Secondary | ICD-10-CM | POA: Diagnosis not present

## 2020-04-21 DIAGNOSIS — R1032 Left lower quadrant pain: Secondary | ICD-10-CM

## 2020-04-21 LAB — CBC WITH DIFFERENTIAL/PLATELET
Basophils Absolute: 0 10*3/uL (ref 0.0–0.1)
Basophils Relative: 0.4 % (ref 0.0–3.0)
Eosinophils Absolute: 0.3 10*3/uL (ref 0.0–0.7)
Eosinophils Relative: 3.3 % (ref 0.0–5.0)
HCT: 42.6 % (ref 36.0–46.0)
Hemoglobin: 14.4 g/dL (ref 12.0–15.0)
Lymphocytes Relative: 39.5 % (ref 12.0–46.0)
Lymphs Abs: 4 10*3/uL (ref 0.7–4.0)
MCHC: 33.8 g/dL (ref 30.0–36.0)
MCV: 81.7 fl (ref 78.0–100.0)
Monocytes Absolute: 0.7 10*3/uL (ref 0.1–1.0)
Monocytes Relative: 6.5 % (ref 3.0–12.0)
Neutro Abs: 5.1 10*3/uL (ref 1.4–7.7)
Neutrophils Relative %: 50.3 % (ref 43.0–77.0)
Platelets: 440 10*3/uL — ABNORMAL HIGH (ref 150.0–400.0)
RBC: 5.22 Mil/uL — ABNORMAL HIGH (ref 3.87–5.11)
RDW: 14.2 % (ref 11.5–15.5)
WBC: 10.1 10*3/uL (ref 4.0–10.5)

## 2020-04-21 LAB — COMPREHENSIVE METABOLIC PANEL
ALT: 19 U/L (ref 0–35)
AST: 14 U/L (ref 0–37)
Albumin: 4.5 g/dL (ref 3.5–5.2)
Alkaline Phosphatase: 84 U/L (ref 39–117)
BUN: 11 mg/dL (ref 6–23)
CO2: 30 mEq/L (ref 19–32)
Calcium: 9.8 mg/dL (ref 8.4–10.5)
Chloride: 102 mEq/L (ref 96–112)
Creatinine, Ser: 0.74 mg/dL (ref 0.40–1.20)
GFR: 99.22 mL/min (ref >60.00)
Glucose, Bld: 97 mg/dL (ref 70–99)
Potassium: 3.7 mEq/L (ref 3.5–5.1)
Sodium: 139 mEq/L (ref 135–145)
Total Bilirubin: 0.3 mg/dL (ref 0.2–1.2)
Total Protein: 7.5 g/dL (ref 6.0–8.3)

## 2020-04-21 LAB — LIPID PANEL
Cholesterol: 232 mg/dL — ABNORMAL HIGH (ref 0–200)
HDL: 55.5 mg/dL (ref >39.00)
LDL Cholesterol: 155 mg/dL — ABNORMAL HIGH (ref 0–99)
NonHDL: 176.22
Total CHOL/HDL Ratio: 4
Triglycerides: 105 mg/dL (ref 0.0–149.0)
VLDL: 21 mg/dL (ref 0.0–40.0)

## 2020-04-21 LAB — TSH: TSH: 1.63 u[IU]/mL (ref 0.35–4.50)

## 2020-04-21 LAB — VITAMIN B12: Vitamin B-12: 456 pg/mL (ref 211–911)

## 2020-04-21 LAB — HEMOGLOBIN A1C: Hgb A1c MFr Bld: 5.9 % (ref 4.6–6.5)

## 2020-04-21 LAB — FOLATE: Folate: 18 ng/mL (ref >5.9)

## 2020-04-21 LAB — T4, FREE: Free T4: 0.79 ng/dL (ref 0.60–1.60)

## 2020-04-21 NOTE — Progress Notes (Signed)
Subjective:     Patricia Pineda is a 54 y.o. female and is here for a comprehensive physical exam. The patient reports problems - L inguinal pain, numbness/tingling.  Pt notes recent f/u with Dr. Shearon Stalls, Pulmonology.  Pt realizing GERD contributing to cough. Taking Omeprazole, notes improvement in coughing.  Pt notes L foot edema at the end of the day.  Resolves in the am.   Pt considering surgery for wt loss.  Endorses b/l knee pain.  Received steroid injections in the past by Ramon Dredge.  Social History   Socioeconomic History  . Marital status: Married    Spouse name: Not on file  . Number of children: Not on file  . Years of education: Not on file  . Highest education level: Not on file  Occupational History  . Occupation: Web designer  Tobacco Use  . Smoking status: Never Smoker  . Smokeless tobacco: Never Used  Substance and Sexual Activity  . Alcohol use: Yes    Alcohol/week: 1.0 standard drinks    Types: 1 Glasses of wine per week    Comment: occassionally  . Drug use: No  . Sexual activity: Yes    Partners: Female    Birth control/protection: Surgical  Other Topics Concern  . Not on file  Social History Narrative   86 year old daughter and 13 year old son   Social Determinants of Radio broadcast assistant Strain:   . Difficulty of Paying Living Expenses:   Food Insecurity:   . Worried About Charity fundraiser in the Last Year:   . Arboriculturist in the Last Year:   Transportation Needs:   . Film/video editor (Medical):   Marland Kitchen Lack of Transportation (Non-Medical):   Physical Activity:   . Days of Exercise per Week:   . Minutes of Exercise per Session:   Stress:   . Feeling of Stress :   Social Connections:   . Frequency of Communication with Friends and Family:   . Frequency of Social Gatherings with Friends and Family:   . Attends Religious Services:   . Active Member of Clubs or Organizations:   . Attends Archivist Meetings:    Marland Kitchen Marital Status:   Intimate Partner Violence:   . Fear of Current or Ex-Partner:   . Emotionally Abused:   Marland Kitchen Physically Abused:   . Sexually Abused:    Health Maintenance  Topic Date Due  . HIV Screening  Never done  . MAMMOGRAM  06/16/2020  . INFLUENZA VACCINE  06/18/2020  . PAP SMEAR-Modifier  06/16/2021  . TETANUS/TDAP  03/18/2022  . COLONOSCOPY  12/25/2023  . COVID-19 Vaccine  Completed    The following portions of the patient's history were reviewed and updated as appropriate: allergies, current medications, past family history, past medical history, past social history, past surgical history and problem list.  Review of Systems Pertinent items noted in HPI and remainder of comprehensive ROS otherwise negative.   Objective:    BP 128/78 (BP Location: Left Arm, Patient Position: Sitting, Cuff Size: Large)   Pulse 78   Temp 98 F (36.7 C) (Temporal)   Wt 241 lb (109.3 kg)   LMP 08/17/2016 (Exact Date)   SpO2 97%   BMI 38.90 kg/m  General appearance: alert, cooperative and no distress Head: Normocephalic, without obvious abnormality, atraumatic Eyes: conjunctivae/corneas clear. PERRL, EOM's intact. Fundi benign. Ears: normal TM's and external ear canals both ears Nose: Nares normal. Septum  midline. Mucosa normal. No drainage or sinus tenderness. Throat: lips, mucosa, and tongue normal; teeth and gums normal Neck: no adenopathy, no carotid bruit, no JVD, supple, symmetrical, trachea midline and thyroid not enlarged, symmetric, no tenderness/mass/nodules Lungs: clear to auscultation bilaterally Heart: regular rate and rhythm, S1, S2 normal, no murmur, click, rub or gallop Abdomen: soft, non-tender; bowel sounds normal; no masses,  no organomegaly L inguinal TTP, no hernia appreciated Extremities: extremities normal, atraumatic, no cyanosis or edema Pulses: 2+ and symmetric Skin: Skin color, texture, turgor normal. No rashes or lesions Lymph nodes: Cervical,  supraclavicular, and axillary nodes normal. Neurologic: Alert and oriented X 3, normal strength and tone. Normal symmetric reflexes. Normal coordination and gait    Assessment:    Healthy female exam.      Plan:     Anticipatory guidance given including wearing seatbelts, smoke detectors in the home, increasing physical activity, increasing p.o. intake of water and vegetables. -will obtain labs -pap due 05/2021 -Patient to schedule mammogram. -given handout -next CPE in 1 yr See After Visit Summary for Counseling Recommendations    Left inguinal pain  -given h/o LAVH/L oopherectomy will obtain imaging. -discussed possible causes including adhesions, hernia, diverticulitis. - Plan: CT ABDOMEN PELVIS W WO CONTRAST  Essential hypertension  -controlled -continue lifestyle modifications -continue norvasc 10 mg.   -for continued or worsened LE edema consider d/c'ing norvasc. - Plan: Comprehensive metabolic panel  Pedal edema  -L foot -discussed supportive care.  Elevating LEs, reducing sodium intake - Plan: Comprehensive metabolic panel  Mixed hyperlipidemia -lifestyle modifications -continue simvastatin 10 mg  - Plan: Lipid panel  Obesity due to excess calories with serious comorbidity, unspecified classification - Plan: TSH, T4, Free, Hemoglobin A1c, Ambulatory referral to General Surgery  Numbness and tingling  - Plan: Vitamin B12, Folate  F/u prn  Grier Mitts, MD

## 2020-04-21 NOTE — Patient Instructions (Addendum)
Hernia, Adult     A hernia happens when tissue inside your body pushes out through a weak spot in your belly muscles (abdominal wall). This makes a round lump (bulge). The lump may be:  In a scar from surgery that was done in your belly (incisional hernia).  Near your belly button (umbilical hernia).  In your groin (inguinal hernia). Your groin is the area where your leg meets your lower belly (abdomen). This kind of hernia could also be: ? In your scrotum, if you are female. ? In folds of skin around your vagina, if you are female.  In your upper thigh (femoral hernia).  Inside your belly (hiatal hernia). This happens when your stomach slides above the muscle between your belly and your chest (diaphragm). If your hernia is small and it does not cause pain, you may not need treatment. If your hernia is large or it causes pain, you may need surgery. Follow these instructions at home: Activity  Avoid stretching or overusing (straining) the muscles near your hernia. Straining can happen when you: ? Lift something heavy. ? Poop (have a bowel movement).  Do not lift anything that is heavier than 10 lb (4.5 kg), or the limit that you are told, until your doctor says that it is safe.  Use the strength of your legs when you lift something heavy. Do not use only your back muscles to lift. General instructions  Do these things if told by your doctor so you do not have trouble pooping (constipation): ? Drink enough fluid to keep your pee (urine) pale yellow. ? Eat foods that are high in fiber. These include fresh fruits and vegetables, whole grains, and beans. ? Limit foods that are high in fat and processed sugars. These include foods that are fried or sweet. ? Take medicine for trouble pooping.  When you cough, try to cough gently.  You may try to push your hernia in by very gently pressing on it when you are lying down. Do not try to force the bulge back in if it will not push in  easily.  If you are overweight, work with your doctor to lose weight safely.  Do not use any products that have nicotine or tobacco in them. These include cigarettes and e-cigarettes. If you need help quitting, ask your doctor.  If you will be having surgery (hernia repair), watch your hernia for changes in shape, size, or color. Tell your doctor if you see any changes.  Take over-the-counter and prescription medicines only as told by your doctor.  Keep all follow-up visits as told by your doctor. Contact a doctor if:  You get new pain, swelling, or redness near your hernia.  You poop fewer times in a week than normal.  You have trouble pooping.  You have poop (stool) that is more dry than normal.  You have poop that is harder or larger than normal. Get help right away if:  You have a fever.  You have belly pain that gets worse.  You feel sick to your stomach (nauseous).  You throw up (vomit).  Your hernia cannot be pushed in by very gently pressing on it when you are lying down. Do not try to force the bulge back in if it will not push in easily.  Your hernia: ? Changes in shape or size. ? Changes color. ? Feels hard or it hurts when you touch it. These symptoms may represent a serious problem that is an emergency. Do not   wait to see if the symptoms will go away. Get medical help right away. Call your local emergency services (911 in the [place] Summary  A hernia happens when tissue inside your body pushes out through a weak spot in the belly muscles. This creates a bulge.  If your hernia is small and it does not hurt, you may not need treatment. If your hernia is large or it hurts, you may need surgery.  If you will be having surgery, watch your hernia for changes in shape, size, or color. Tell your doctor about any changes. This information is not intended to replace advice given to you by your health care provider. Make sure you discuss any questions you have with  your health care provider. Document Revised: 02/25/2019 Document Reviewed: 08/06/2017 Elsevier Patient Education  2020 [employer] Calories are units of energy. Your body needs a certain amount of calories from food to keep you going throughout the day. When you eat more calories than your body needs, your body stores the extra calories as fat. When you eat fewer calories than your body needs, your body burns fat to get the energy it needs. Calorie counting means keeping track of how many calories you eat and drink each day. Calorie counting can be helpful if you need to lose weight. If you make sure to eat fewer calories than your body needs, you should lose weight. Ask your health care provider what a healthy weight is for you. For calorie counting to work, you will need to eat the right number of calories in a day in order to lose a healthy amount of weight per week. A dietitian can help you determine how many calories you need in a day and will give you suggestions on how to reach your calorie goal.  A healthy amount of weight to lose per week is usually 1-2 lb (0.5-0.9 kg). This usually means that your daily calorie intake should be reduced by 500-750 calories.  Eating 1,200 - 1,500 calories per day can help most women lose weight.  Eating 1,500 - 1,800 calories per day can help most men lose weight. What is my plan? My goal is to have __________ calories per day. If I have this many calories per day, I should lose around __________ pounds per week. What do I need to know about calorie counting? In order to meet your daily calorie goal, you will need to:  Find out how many calories are in each food you would like to eat. Try to do this before you eat.  Decide how much of the food you plan to eat.  Write down what you ate and how many calories it had. Doing this is called keeping a food log. To successfully lose weight, it is important to balance  calorie counting with a healthy lifestyle that includes regular activity. Aim for 150 minutes of moderate exercise (such as walking) or 75 minutes of vigorous exercise (such as running) each week. Where do I find calorie information?  The number of calories in a food can be found on a Nutrition Facts label. If a food does not have a Nutrition Facts label, try to look up the calories online or ask your dietitian for help. Remember that calories are listed per serving. If you choose to have more than one serving of a food, you will have to multiply the calories per serving by the amount of servings you plan to eat. For example,  the label on a package of bread might say that a serving size is 1 slice and that there are 90 calories in a serving. If you eat 1 slice, you will have eaten 90 calories. If you eat 2 slices, you will have eaten 180 calories. How do I keep a food log? Immediately after each meal, record the following information in your food log:  What you ate. Don't forget to include toppings, sauces, and other extras on the food.  How much you ate. This can be measured in cups, ounces, or number of items.  How many calories each food and drink had.  The total number of calories in the meal. Keep your food log near you, such as in a small notebook in your pocket, or use a mobile app or website. Some programs will calculate calories for you and show you how many calories you have left for the day to meet your goal. What are some calorie counting tips?   Use your calories on foods and drinks that will fill you up and not leave you hungry: ? Some examples of foods that fill you up are nuts and nut butters, vegetables, lean proteins, and high-fiber foods like whole grains. High-fiber foods are foods with more than 5 g fiber per serving. ? Drinks such as sodas, specialty coffee drinks, alcohol, and juices have a lot of calories, yet do not fill you up.  Eat nutritious foods and avoid empty  calories. Empty calories are calories you get from foods or beverages that do not have many vitamins or protein, such as candy, sweets, and soda. It is better to have a nutritious high-calorie food (such as an avocado) than a food with few nutrients (such as a bag of chips).  Know how many calories are in the foods you eat most often. This will help you calculate calorie counts faster.  Pay attention to calories in drinks. Low-calorie drinks include water and unsweetened drinks.  Pay attention to nutrition labels for "low fat" or "fat free" foods. These foods sometimes have the same amount of calories or more calories than the full fat versions. They also often have added sugar, starch, or salt, to make up for flavor that was removed with the fat.  Find a way of tracking calories that works for you. Get creative. Try different apps or programs if writing down calories does not work for you. What are some portion control tips?  Know how many calories are in a serving. This will help you know how many servings of a certain food you can have.  Use a measuring cup to measure serving sizes. You could also try weighing out portions on a kitchen scale. With time, you will be able to estimate serving sizes for some foods.  Take some time to put servings of different foods on your favorite plates, bowls, and cups so you know what a serving looks like.  Try not to eat straight from a bag or box. Doing this can lead to overeating. Put the amount you would like to eat in a cup or on a plate to make sure you are eating the right portion.  Use smaller plates, glasses, and bowls to prevent overeating.  Try not to multitask (for example, watch TV or use your computer) while eating. If it is time to eat, sit down at a table and enjoy your food. This will help you to know when you are full. It will also help you to be aware  of what you are eating and how much you are eating. What are tips for following this  plan? Reading food labels  Check the calorie count compared to the serving size. The serving size may be smaller than what you are used to eating.  Check the source of the calories. Make sure the food you are eating is high in vitamins and protein and low in saturated and trans fats. Shopping  Read nutrition labels while you shop. This will help you make healthy decisions before you decide to purchase your food.  Make a grocery list and stick to it. Cooking  Try to cook your favorite foods in a healthier way. For example, try baking instead of frying.  Use low-fat dairy products. Meal planning  Use more fruits and vegetables. Half of your plate should be fruits and vegetables.  Include lean proteins like poultry and fish. How do I count calories when eating out?  Ask for smaller portion sizes.  Consider sharing an entree and sides instead of getting your own entree.  If you get your own entree, eat only half. Ask for a box at the beginning of your meal and put the rest of your entree in it so you are not tempted to eat it.  If calories are listed on the menu, choose the lower calorie options.  Choose dishes that include vegetables, fruits, whole grains, low-fat dairy products, and lean protein.  Choose items that are boiled, broiled, grilled, or steamed. Stay away from items that are buttered, battered, fried, or served with cream sauce. Items labeled "crispy" are usually fried, unless stated otherwise.  Choose water, low-fat milk, unsweetened iced tea, or other drinks without added sugar. If you want an alcoholic beverage, choose a lower calorie option such as a glass of wine or light beer.  Ask for dressings, sauces, and syrups on the side. These are usually high in calories, so you should limit the amount you eat.  If you want a salad, choose a garden salad and ask for grilled meats. Avoid extra toppings like bacon, cheese, or fried items. Ask for the dressing on the side,  or ask for olive oil and vinegar or lemon to use as dressing.  Estimate how many servings of a food you are given. For example, a serving of cooked rice is  cup or about the size of half a baseball. Knowing serving sizes will help you be aware of how much food you are eating at restaurants. The list below tells you how big or small some common portion sizes are based on everyday objects: ? 1 oz--4 stacked dice. ? 3 oz--1 deck of cards. ? 1 tsp--1 die. ? 1 Tbsp-- a ping-pong ball. ? 2 Tbsp--1 ping-pong ball. ?  cup-- baseball. ? 1 cup--1 baseball. Summary  Calorie counting means keeping track of how many calories you eat and drink each day. If you eat fewer calories than your body needs, you should lose weight.  A healthy amount of weight to lose per week is usually 1-2 lb (0.5-0.9 kg). This usually means reducing your daily calorie intake by 500-750 calories.  The number of calories in a food can be found on a Nutrition Facts label. If a food does not have a Nutrition Facts label, try to look up the calories online or ask your dietitian for help.  Use your calories on foods and drinks that will fill you up, and not on foods and drinks that will leave you hungry.  Use  smaller plates, glasses, and bowls to prevent overeating. This information is not intended to replace advice given to you by your health care provider. Make sure you discuss any questions you have with your health care provider. Document Revised: 07/24/2018 Document Reviewed: 10/04/2016 Elsevier Patient Education  [employer].

## 2020-04-24 ENCOUNTER — Encounter: Payer: Self-pay | Admitting: Family Medicine

## 2020-04-25 ENCOUNTER — Other Ambulatory Visit: Payer: Self-pay | Admitting: Family Medicine

## 2020-06-06 ENCOUNTER — Other Ambulatory Visit: Payer: Self-pay | Admitting: Family Medicine

## 2020-06-06 DIAGNOSIS — I10 Essential (primary) hypertension: Secondary | ICD-10-CM

## 2020-06-28 ENCOUNTER — Telehealth: Payer: Self-pay | Admitting: Family Medicine

## 2020-06-28 NOTE — Telephone Encounter (Signed)
error 

## 2020-06-30 ENCOUNTER — Telehealth: Payer: Self-pay | Admitting: Internal Medicine

## 2020-06-30 ENCOUNTER — Other Ambulatory Visit: Payer: Self-pay | Admitting: Family Medicine

## 2020-06-30 DIAGNOSIS — R05 Cough: Secondary | ICD-10-CM

## 2020-06-30 DIAGNOSIS — R059 Cough, unspecified: Secondary | ICD-10-CM

## 2020-06-30 MED ORDER — PREDNISONE 10 MG PO TABS: 20.0000 mg | ORAL_TABLET | Freq: Every day | ORAL | 0 refills | Status: AC

## 2020-06-30 NOTE — Telephone Encounter (Signed)
She should take Famotidine (Pepcid) 20mg  at night she can get this over the counter. Please send in 20mg  prednisone once daily x 5 days. Follow up with Shearon Stalls if not better.

## 2020-06-30 NOTE — Telephone Encounter (Signed)
Called and spoke patient to let her know about Beth's recs of Famotidine (Pepcid) 20mg  at night and that we were sending RX for Prednisone 20mg  for 5 days. Patient expressed understanding. Encouraged her to keep appointment already scheduled with Dr. Shearon Stalls. Nothing further needed at this time.

## 2020-06-30 NOTE — Telephone Encounter (Signed)
Last 2-3 weeks patient has been having issues with wheezing, and upper back pain. Makes it hard to sleep at night. States that her husband had told her that he can hear her wheezing when they are sleeping. She has started exercising and is doing good with that. Symptoms seem to mostly at night when she feels much worse and is not getting any sleep. Been using rescue inhaler more than normal is not feeling any relief. Also been using Qvar. Does not use any nebulized medications.  Beth please advise as this is a Dr. Shearon Stalls patient

## 2020-07-28 ENCOUNTER — Other Ambulatory Visit: Payer: Self-pay | Admitting: Family Medicine

## 2020-07-28 DIAGNOSIS — Z8709 Personal history of other diseases of the respiratory system: Secondary | ICD-10-CM

## 2020-07-28 DIAGNOSIS — R059 Cough, unspecified: Secondary | ICD-10-CM

## 2020-08-10 ENCOUNTER — Encounter: Payer: Self-pay | Admitting: Internal Medicine

## 2020-08-10 ENCOUNTER — Ambulatory Visit: Payer: Managed Care, Other (non HMO) | Admitting: Internal Medicine

## 2020-08-10 ENCOUNTER — Other Ambulatory Visit: Payer: Self-pay

## 2020-08-10 VITALS — BP 139/86 | HR 98 | Temp 96.5°F | Ht 66.0 in | Wt 247.6 lb

## 2020-08-10 DIAGNOSIS — K219 Gastro-esophageal reflux disease without esophagitis: Secondary | ICD-10-CM | POA: Diagnosis not present

## 2020-08-10 DIAGNOSIS — J4540 Moderate persistent asthma, uncomplicated: Secondary | ICD-10-CM | POA: Diagnosis not present

## 2020-08-10 MED ORDER — FLUTICASONE-SALMETEROL 250-50 MCG/DOSE IN AEPB: 1.0000 | INHALATION_SPRAY | Freq: Two times a day (BID) | RESPIRATORY_TRACT | 5 refills | Status: DC

## 2020-08-10 MED ORDER — OMEPRAZOLE 40 MG PO CPDR: 40.0000 mg | DELAYED_RELEASE_CAPSULE | Freq: Every day | ORAL | 5 refills | Status: DC

## 2020-08-10 NOTE — Patient Instructions (Signed)
The patient should have follow up scheduled with myself in 3 months.  Stop taking Qvar. Start taking Advair. Start taking Prilosec once daily.  Instructions For Advair Diskus Take the ADVAIR DISKUS out of the box and foil overwrap pouch. Write the "Pouch opened" and "Use by" dates on the label on top of the DISKUS. The "Use by" date is 1 month from date of opening the pouch.     * The DISKUS will be in the closed position when the pouch is opened.     * The dose indicator on the top of the DISKUS tells you how many doses are left. The dose indicator number will decrease each time you use the DISKUS. After you have used 55 doses from the DISKUS, the numbers 5 to 0 will appear in red to warn you that there are only a few doses left. If you are using a "sample" DISKUS, the numbers 5 to 0 will appear in red after 23 doses.  Taking a dose from the DISKUS requires the following 3 simple steps: Open, Click, Inhale.  1. OPEN Hold the DISKUS in one hand and put the thumb of your other hand on the thumbgrip. Push your thumb away from you as far as it will go until the mouthpiece appears and snaps into position.  2.   CLICK Hold the DISKUS in a level, flat position with the mouthpiece toward you. Slide the lever away from you as far as it will go until it clicks. The DISKUS is now ready to use. Every time the lever is pushed back, a dose is ready to be inhaled. This is shown by a decrease in numbers on the dose counter. To avoid releasing or wasting doses once the DISKUS is ready: Do not close the DISKUS. Do not tilt the DISKUS. Do not play with the lever. Do not move the lever more than once.  3.   INHALE Before inhaling your dose from the DISKUS, breathe out (exhale) fully while holding the DISKUS level and away from your mouth. Remember, never breathe out into the DISKUS mouthpiece.  Put the mouthpiece to your lips. Breathe in quickly and deeply through the DISKUS. Do not breathe in through your  nose.  Remove the DISKUS from your mouth. Hold your breath for about 10 seconds, or for as long as is comfortable. Breathe out slowly.  The DISKUS delivers your dose of medicine as a very fine powder. Most patients can taste or feel the powder. Do not use another dose from the DISKUS if you do not feel or taste the medicine.  Rinse your mouth with water after breathing in the medicine. Spit the water out. Do not swallow.  4.  CLOSE the DISKUS when you are finished taking a dose so that the DISKUS will be ready for you to take your next dose. Put your thumb on the thumbgrip and slide the thumbgrip back toward you as far as it will go. The DISKUS will click shut. The lever will automatically return to its original position. The DISKUS is now ready for you to take your next scheduled dose, due in about 12 hours. (Repeat the steps 1 to 4.)  REMEMBER: Never breathe into the DISKUS. Never take the DISKUS apart. Always ready and use the DISKUS in a level, flat position. Do not use the DISKUS with a spacer device. After each dose, rinse your mouth with water and spit the water out. Do not swallow. Never wash the mouthpiece or  any part of the DISKUS. Keep it dry. Always keep the DISKUS in a dry place. Never take an extra dose, even if you did not taste or feel the medicine.  What is GERD? Gastroesophageal reflux disease (GERD) is gastroesophageal reflux diseasewhich occurs when the lower esophageal sphincter (LES) opens spontaneously, for varying periods of time, or does not close properly and stomach contents rise up into the esophagus. GER is also called acid reflux or acid regurgitation, because digestive juices--called acids--rise up with the food. The esophagus is the tube that carries food from the mouth to the stomach. The LES is a ring of muscle at the bottom of the esophagus that acts like a valve between the esophagus and stomach.  When acid reflux occurs, food or fluid can be tasted in the  back of the mouth. When refluxed stomach acid touches the lining of the esophagus it may cause a burning sensation in the chest or throat called heartburn or acid indigestion. Occasional reflux is common. Persistent reflux that occurs more than twice a week is considered GERD, and it can eventually lead to more serious health problems. People of all ages can have GERD. Studies have shown that GERD may worsen or contribute to asthma, chronic cough, and pulmonary fibrosis.   What are the symptoms of GERD? The main symptom of GERD in adults is frequent heartburn, also called acid indigestion--burning-type pain in the lower part of the mid-chest, behind the breast bone, and in the mid-abdomen.  Not all reflux is acidic in nature, and many patients don't have heart burn at all. Sometimes it feels like a cough (either dry or with mucus), choking sensation, asthma, shortness of breath, waking up at night, frequent throat clearing, or trouble swallowing.    What causes GERD? The reason some people develop GERD is still unclear. However, research shows that in people with GERD, the LES relaxes while the rest of the esophagus is working. Anatomical abnormalities such as a hiatal hernia may also contribute to GERD. A hiatal hernia occurs when the upper part of the stomach and the LES move above the diaphragm, the muscle wall that separates the stomach from the chest. Normally, the diaphragm helps the LES keep acid from rising up into the esophagus. When a hiatal hernia is present, acid reflux can occur more easily. A hiatal hernia can occur in people of any age and is most often a normal finding in otherwise healthy people over age 50. Most of the time, a hiatal hernia produces no symptoms.   Other factors that may contribute to GERD include - Obesity or recent weight gain - Pregnancy  - Smoking  - Diet - Certain medications  Common foods that can worsen reflux symptoms include: - carbonated beverages -  artificial sweeteners - citrus fruits  - chocolate  - drinks with caffeine or alcohol  - fatty and fried foods  - garlic and onions  - mint flavorings  - spicy foods  - tomato-based foods, like spaghetti sauce, salsa, chili, and pizza   Lifestyle Changes If you smoke, stop.  Avoid foods and beverages that worsen symptoms (see above.) Lose weight if needed.  Eat small, frequent meals.  Wear loose-fitting clothes.  Avoid lying down for 3 hours after a meal.  Raise the head of your bed 6 to 8 inches by securing wood blocks under the bedposts. Just using extra pillows will not help, but using a wedge-shaped pillow may be helpful.  Medications  H2 blockers, such as  cimetidine (Tagamet HB), famotidine (Pepcid AC), nizatidine (Axid AR), and ranitidine (Zantac 75), decrease acid production. They are available in prescription strength and over-the-counter strength. These drugs provide short-term relief and are effective for about half of those who have GERD symptoms.  Proton pump inhibitors include omeprazole (Prilosec, Zegerid), lansoprazole (Prevacid), pantoprazole (Protonix), rabeprazole (Aciphex), and esomeprazole (Nexium), which are available by prescription. Prilosec is also available in over-the-counter strength. Proton pump inhibitors are more effective than H2 blockers and can relieve symptoms and heal the esophageal lining in almost everyone who has GERD.  Because drugs work in different ways, combinations of medications may help control symptoms. People who get heartburn after eating may take both antacids and H2 blockers. The antacids work first to neutralize the acid in the stomach, and then the H2 blockers act on acid production. By the time the antacid stops working, the H2 blocker will have stopped acid production. Your health care provider is the best source of information about how to use medications for GERD.   Points to Remember 1. You can have GERD without having heartburn.  Your symptoms could include a dry cough, asthma symptoms, or trouble swallowing.  2. Taking medications daily as prescribed is important in controlling you symptoms.  Sometimes it can take up to 8 weeks to fully achieve the effects of the medications prescribed.  3. Coughing related to GERD can be difficult to treat and is very frustrating!  However, it is important to stick with these medications and lifestyle modifications before pursuing more aggressive or invasive test and treatments.

## 2020-08-10 NOTE — Progress Notes (Signed)
Patricia Pineda    [id number]    Nov 27, 1965  Primary Care Physician:Banks, Langley Adie, MD Date of Appointment: 08/10/2020 Established Patient Visit  Chief complaint:   Chief Complaint  Patient presents with  . Follow-up    increase in sob, some dry coughing at night, wheezing at night    HPI: Moderate persistent asthma started on Qvar in Jan 2021  Interval Updates: Having worsening symptoms of shortness of breath and wheezing. ACT is down today. No ED visits or exacerbations requiring prednisone. Coughs at night time which is dry. Having tightness in her mid-upper back that albuterol doesn't seem to relieve.   Sleeps with three pillows. Side sleeper. Stopped nexium because she felt it was making wheezing worse. Started pepcid which helped but is still having break through reflux.   Current Regimen: albuterol prn, Qvar, singulair Asthma Triggers: smoke, exercise Exacerbations in the last year: none History of hospitalization or intubation: never Hives: yes, in cold weather, shellfish Allergy Testing: never GERD: yes on pepcid. Stopped PPI. Allergic Rhinitis:  none ACT:  Asthma Control Test ACT Total Score  08/10/2020 14  03/02/2020 23  02/03/2020 14   I have reviewed the patient's family social and past medical history and updated as appropriate.   Past Medical History:  Diagnosis Date  . Anemia   . Arthritis    knees  . High cholesterol   . Hypertension   . Pneumonia     Past Surgical History:  Procedure Laterality Date  . ABDOMINAL HYSTERECTOMY  09/17/2016  . CESAREAN SECTION    . HYSTERECTOMY ABDOMINAL WITH SALPINGECTOMY Bilateral 09/17/2016   Procedure: HYSTERECTOMY ABDOMINAL WITH BILATERAL SALPINGECTOMY, LEFT OOPHORECTOMY;  Surgeon: Marylynn Pearson, MD;  Location: [facility];  Service: Gynecology;  Laterality: Bilateral;    Family History  Problem Relation Age of Onset  . Hypertension Father   . Prostate cancer Father   . Heart attack Maternal  Grandmother        early 42s  . Arthritis Paternal Grandmother   . Asthma Child   . Colon cancer Neg Hx   . Esophageal cancer Neg Hx   . Rectal cancer Neg Hx   . Stomach cancer Neg Hx     Social History   Occupational History  . Occupation: Web designer  Tobacco Use  . Smoking status: Never Smoker  . Smokeless tobacco: Never Used  Vaping Use  . Vaping Use: Never used  Substance and Sexual Activity  . Alcohol use: Yes    Alcohol/week: 1.0 standard drink    Types: 1 Glasses of wine per week    Comment: occassionally  . Drug use: No  . Sexual activity: Yes    Partners: Female    Birth control/protection: Surgical   Physical Exam: Blood pressure 139/86, pulse 98, temperature (!) 96.5 F (35.8 C), temperature source Temporal, height 5\' 6"  (1.676 m), weight 247 lb 9.6 oz (112.3 kg), last menstrual period 08/17/2016, SpO2 99 %.  Gen: no distress, no increased work of breathing ENT: mmm, mallampati IV CV: RRR no mrg Res: ctab, no wheezes or crackles  Data Reviewed: Imaging: Chest xray 07/2019 with no acute cardiopulmonary abnormalities.   PFTs: Spirometry shows no airflow limitation. No BD response.  Labs: Lab Results  Component Value Date   NA 139 04/21/2020   K 3.7 04/21/2020   CL 102 04/21/2020   CO2 30 04/21/2020   Lab Results  Component Value Date   WBC 10.1  04/21/2020   HGB 14.4 04/21/2020   HCT 42.6 04/21/2020   MCV 81.7 04/21/2020   PLT 440.0 (H) 04/21/2020    Immunization status: Immunization History  Administered Date(s) Administered  . Influenza Whole 12/15/2012, 09/21/2013  . Influenza,inj,Quad PF,6+ Mos 10/30/2019  . Influenza-Unspecified 10/03/2016  . PFIZER SARS-COV-2 Vaccination 01/17/2020, 02/09/2020  . Tdap 03/18/2012    Assessment:  Mild persistent Asthma, not well controlled GERD, not well control Rhinitis, controlled  Plan/Recommendations: Continue prn albuterol, singulair.  Switch from Qvar to Advair for asthma.   Switch from pepcid to Prilosec as she didn't like Nexium.    Return to Care: Return in about 3 months (around 11/09/2020).  Patricia Llamas, MD Pulmonary and [place]

## 2020-09-04 ENCOUNTER — Other Ambulatory Visit: Payer: Self-pay | Admitting: Family Medicine

## 2020-09-04 DIAGNOSIS — I10 Essential (primary) hypertension: Secondary | ICD-10-CM

## 2020-11-18 DIAGNOSIS — K760 Fatty (change of) liver, not elsewhere classified: Secondary | ICD-10-CM

## 2020-11-18 HISTORY — DX: Fatty (change of) liver, not elsewhere classified: K76.0

## 2020-12-11 ENCOUNTER — Other Ambulatory Visit: Payer: Self-pay

## 2020-12-11 ENCOUNTER — Other Ambulatory Visit: Payer: Self-pay | Admitting: Family Medicine

## 2020-12-11 DIAGNOSIS — I10 Essential (primary) hypertension: Secondary | ICD-10-CM

## 2020-12-11 MED ORDER — AMLODIPINE BESYLATE 10 MG PO TABS: 10.0000 mg | ORAL_TABLET | Freq: Every day | ORAL | 0 refills | Status: DC

## 2021-01-24 ENCOUNTER — Other Ambulatory Visit: Payer: Self-pay | Admitting: Family Medicine

## 2021-01-24 DIAGNOSIS — R059 Cough, unspecified: Secondary | ICD-10-CM

## 2021-01-29 ENCOUNTER — Other Ambulatory Visit: Payer: Self-pay

## 2021-01-29 ENCOUNTER — Ambulatory Visit: Payer: Managed Care, Other (non HMO) | Admitting: Internal Medicine

## 2021-01-29 ENCOUNTER — Ambulatory Visit: Payer: Self-pay | Admitting: Internal Medicine

## 2021-01-29 ENCOUNTER — Encounter: Payer: Self-pay | Admitting: Internal Medicine

## 2021-01-29 ENCOUNTER — Telehealth: Payer: Self-pay | Admitting: Internal Medicine

## 2021-01-29 VITALS — BP 128/74 | HR 86 | Temp 98.2°F | Ht 66.0 in | Wt 245.2 lb

## 2021-01-29 DIAGNOSIS — T148XXA Other injury of unspecified body region, initial encounter: Secondary | ICD-10-CM

## 2021-01-29 DIAGNOSIS — J4541 Moderate persistent asthma with (acute) exacerbation: Secondary | ICD-10-CM | POA: Diagnosis not present

## 2021-01-29 NOTE — Telephone Encounter (Signed)
Called and spoke with pt and she is aware of appt today with ND.

## 2021-01-29 NOTE — Telephone Encounter (Signed)
Last seen 07/2020.   Dr. Shearon Stalls has schedule this evening with several openings. Would this work for her to be seen ?

## 2021-01-29 NOTE — Patient Instructions (Addendum)
The patient should have follow up scheduled with myself in 3 months.   Prior to next visit patient should have: Spirometry/Feno  Take ibuprofen 600 mg up to three times a day with food - for the next three days.  Try warm compresses and massage, as well sports rub like Ephraim Hamburger for your left back/shoulder area.   Continue taking albuterol as needed until you are getting over this hump. Please call me back if wheezing and tightness across your chest get worse.

## 2021-01-29 NOTE — Progress Notes (Signed)
Patricia Pineda    [id number]    10-Dec-1965  Primary Care Physician:Banks, Langley Adie, MD Date of Appointment: 01/29/2021 Established Patient Visit  Chief complaint:   Chief Complaint  Patient presents with  . Follow-up    Increased wheezing and dry cough at night has been going on for the past 4-5 days. Denies any fevers. SOB has increased as well.     HPI: Moderate persistent asthma started on Qvar in Jan 2021  Interval Updates: Here today with worsening shortness of breath, cough, and wheezing.  Taking albuterol 2-3 times/day and is helping. Still taking advair twice a day, no problems with thrush.  Also waking up at night to take albuterol. She does not currently use a spacer. No recent sick contacts. She cannot think of what is worsening her symptoms other than changes in weather. Denies worsening rhinorrhea. She is eating earlier which is helping her reflux. She has back pain rather than chest tightness. Worse with taking a deep breath in, but also reproducible to palpation. Her husband has been massaging it which helps.   No fevers, chills.   Current Regimen: albuterol prn, advair, singulair Asthma Triggers: smoke, exercise Exacerbations in the last year: none History of hospitalization or intubation: never Hives: yes, in cold weather, shellfish Allergy Testing: never GERD: controlled on pepcid.  Allergic Rhinitis:  none ACT:  Asthma Control Test ACT Total Score  08/10/2020 14  03/02/2020 23  02/03/2020 14   I have reviewed the patient's family social and past medical history and updated as appropriate.   Past Medical History:  Diagnosis Date  . Anemia   . Arthritis    knees  . High cholesterol   . Hypertension   . Pneumonia     Past Surgical History:  Procedure Laterality Date  . ABDOMINAL HYSTERECTOMY  09/17/2016  . CESAREAN SECTION    . HYSTERECTOMY ABDOMINAL WITH SALPINGECTOMY Bilateral 09/17/2016   Procedure: HYSTERECTOMY ABDOMINAL WITH  BILATERAL SALPINGECTOMY, LEFT OOPHORECTOMY;  Surgeon: Marylynn Pearson, MD;  Location: [facility];  Service: Gynecology;  Laterality: Bilateral;    Family History  Problem Relation Age of Onset  . Hypertension Father   . Prostate cancer Father   . Heart attack Maternal Grandmother        early 19s  . Arthritis Paternal Grandmother   . Asthma Child   . Colon cancer Neg Hx   . Esophageal cancer Neg Hx   . Rectal cancer Neg Hx   . Stomach cancer Neg Hx     Social History   Occupational History  . Occupation: Web designer  Tobacco Use  . Smoking status: Never Smoker  . Smokeless tobacco: Never Used  Vaping Use  . Vaping Use: Never used  Substance and Sexual Activity  . Alcohol use: Yes    Alcohol/week: 1.0 standard drink    Types: 1 Glasses of wine per week    Comment: occassionally  . Drug use: No  . Sexual activity: Yes    Partners: Female    Birth control/protection: Surgical   Physical Exam: Blood pressure 128/74, pulse 86, temperature 98.2 F (36.8 C), temperature source Temporal, height 5\' 6"  (1.676 m), weight 245 lb 3.2 oz (111.2 kg), last menstrual period 08/17/2016, SpO2 96 %.  Gen: no distress, no increased wob. ENT: mmm,  CV: RRR Res: good symmetric air entry bilaterally, no crackles, one mild end expiratory wheeze Back: left shoulder and back tenderness to palpation along scapula.  Data Reviewed: Imaging: Chest xray 07/2019 with no acute cardiopulmonary abnormalities.   PFTs: Spirometry shows no airflow limitation. No BD response.  Labs: Lab Results  Component Value Date   NA 139 04/21/2020   K 3.7 04/21/2020   CL 102 04/21/2020   CO2 30 04/21/2020   Lab Results  Component Value Date   WBC 10.1 04/21/2020   HGB 14.4 04/21/2020   HCT 42.6 04/21/2020   MCV 81.7 04/21/2020   PLT 440.0 (H) 04/21/2020    Immunization status: Immunization History  Administered Date(s) Administered  . Influenza Whole 12/15/2012, 09/21/2013  .  Influenza,inj,Quad PF,6+ Mos 10/30/2019  . Influenza-Unspecified 10/03/2016  . PFIZER(Purple Top)SARS-COV-2 Vaccination 01/17/2020, 02/09/2020  . Tdap 03/18/2012    Assessment:  Moderate persistent Asthma with acute exacerbation GERD, controlled Rhinitis, controlled Acute muscle strain  Plan/Recommendations: Continue albuterol and advair for asthma. Suspect she will need a couple more day sof increased albuterol dosing. Exacerbation resolving. I do not think she needs prednisone today. Suspect back pain related to muscle strain. Recommend warm compresses, heating paid, nsaids, ben gay.  Continue treatment for reflux and rhinitis   Return to Care: Return in about 3 months (around 05/01/2021).  [name], MD Pulmonary and [place]

## 2021-01-29 NOTE — Telephone Encounter (Signed)
I have called and spoke with pt and she stated that since she last had covid, she was dx with asthma.  She stated that over the weekend she started with allergies?  And a cough that is dry.  She said that the cough is more of her clearing her throat.  She stated that she has been wheezing and her husband can hear her at night when she is sleeping.  She stated that she is also having back pain and she had the same back pain when she had PNA.  She is very concerned.  Pt would like to have cxr done.  She has been using her advair and rescue inhaler as directed.  ND is night float, TP please advise. Thanks

## 2021-03-13 ENCOUNTER — Other Ambulatory Visit: Payer: Self-pay | Admitting: Obstetrics and Gynecology

## 2021-03-13 DIAGNOSIS — R928 Other abnormal and inconclusive findings on diagnostic imaging of breast: Secondary | ICD-10-CM

## 2021-03-31 ENCOUNTER — Other Ambulatory Visit: Payer: Self-pay

## 2021-03-31 ENCOUNTER — Other Ambulatory Visit: Payer: Self-pay | Admitting: Obstetrics and Gynecology

## 2021-03-31 ENCOUNTER — Ambulatory Visit
Admission: RE | Admit: 2021-03-31 | Discharge: 2021-03-31 | Disposition: A | Discharge status: Home / Self Care | Payer: Managed Care, Other (non HMO) | Source: Ambulatory Visit | Attending: Obstetrics and Gynecology | Admitting: Obstetrics and Gynecology

## 2021-03-31 DIAGNOSIS — R928 Other abnormal and inconclusive findings on diagnostic imaging of breast: Secondary | ICD-10-CM

## 2021-04-04 ENCOUNTER — Other Ambulatory Visit: Payer: Managed Care, Other (non HMO)

## 2021-04-12 ENCOUNTER — Ambulatory Visit
Admission: RE | Admit: 2021-04-12 | Discharge: 2021-04-12 | Disposition: A | Discharge status: Home / Self Care | Payer: Managed Care, Other (non HMO) | Source: Ambulatory Visit | Attending: Obstetrics and Gynecology | Admitting: Obstetrics and Gynecology

## 2021-04-12 ENCOUNTER — Other Ambulatory Visit: Payer: Self-pay

## 2021-04-12 DIAGNOSIS — R928 Other abnormal and inconclusive findings on diagnostic imaging of breast: Secondary | ICD-10-CM

## 2021-04-23 ENCOUNTER — Other Ambulatory Visit: Payer: Self-pay

## 2021-04-23 ENCOUNTER — Encounter: Payer: Self-pay | Admitting: Family Medicine

## 2021-04-23 ENCOUNTER — Encounter: Payer: Managed Care, Other (non HMO) | Admitting: Family Medicine

## 2021-04-23 ENCOUNTER — Ambulatory Visit (INDEPENDENT_AMBULATORY_CARE_PROVIDER_SITE_OTHER): Payer: Managed Care, Other (non HMO) | Admitting: Family Medicine

## 2021-04-23 VITALS — BP 124/78 | HR 82 | Temp 98.8°F | Ht 66.0 in | Wt 245.6 lb

## 2021-04-23 DIAGNOSIS — R232 Flushing: Secondary | ICD-10-CM | POA: Insufficient documentation

## 2021-04-23 DIAGNOSIS — E782 Mixed hyperlipidemia: Secondary | ICD-10-CM | POA: Diagnosis not present

## 2021-04-23 DIAGNOSIS — Z0001 Encounter for general adult medical examination with abnormal findings: Secondary | ICD-10-CM | POA: Diagnosis not present

## 2021-04-23 DIAGNOSIS — E559 Vitamin D deficiency, unspecified: Secondary | ICD-10-CM | POA: Insufficient documentation

## 2021-04-23 DIAGNOSIS — Z0000 Encounter for general adult medical examination without abnormal findings: Secondary | ICD-10-CM

## 2021-04-23 DIAGNOSIS — J4540 Moderate persistent asthma, uncomplicated: Secondary | ICD-10-CM | POA: Insufficient documentation

## 2021-04-23 DIAGNOSIS — R1032 Left lower quadrant pain: Secondary | ICD-10-CM

## 2021-04-23 DIAGNOSIS — G4700 Insomnia, unspecified: Secondary | ICD-10-CM | POA: Insufficient documentation

## 2021-04-23 DIAGNOSIS — Z6839 Body mass index (BMI) 39.0-39.9, adult: Secondary | ICD-10-CM

## 2021-04-23 LAB — COMPREHENSIVE METABOLIC PANEL
ALT: 21 U/L (ref 0–35)
AST: 15 U/L (ref 0–37)
Albumin: 4.4 g/dL (ref 3.5–5.2)
Alkaline Phosphatase: 77 U/L (ref 39–117)
BUN: 11 mg/dL (ref 6–23)
CO2: 28 mEq/L (ref 19–32)
Calcium: 9.7 mg/dL (ref 8.4–10.5)
Chloride: 103 mEq/L (ref 96–112)
Creatinine, Ser: 0.71 mg/dL (ref 0.40–1.20)
GFR: 96.47 mL/min (ref >60.00)
Glucose, Bld: 90 mg/dL (ref 70–99)
Potassium: 3.7 mEq/L (ref 3.5–5.1)
Sodium: 140 mEq/L (ref 135–145)
Total Bilirubin: 0.4 mg/dL (ref 0.2–1.2)
Total Protein: 7.1 g/dL (ref 6.0–8.3)

## 2021-04-23 LAB — LIPID PANEL
Cholesterol: 218 mg/dL — ABNORMAL HIGH (ref 0–200)
HDL: 54.2 mg/dL (ref >39.00)
LDL Cholesterol: 140 mg/dL — ABNORMAL HIGH (ref 0–99)
NonHDL: 164.04
Total CHOL/HDL Ratio: 4
Triglycerides: 119 mg/dL (ref 0.0–149.0)
VLDL: 23.8 mg/dL (ref 0.0–40.0)

## 2021-04-23 LAB — CBC WITH DIFFERENTIAL/PLATELET
Basophils Absolute: 0 10*3/uL (ref 0.0–0.1)
Basophils Relative: 0.4 % (ref 0.0–3.0)
Eosinophils Absolute: 0.4 10*3/uL (ref 0.0–0.7)
Eosinophils Relative: 4.2 % (ref 0.0–5.0)
HCT: 41.6 % (ref 36.0–46.0)
Hemoglobin: 14.3 g/dL (ref 12.0–15.0)
Lymphocytes Relative: 39.7 % (ref 12.0–46.0)
Lymphs Abs: 3.5 10*3/uL (ref 0.7–4.0)
MCHC: 34.3 g/dL (ref 30.0–36.0)
MCV: 79.1 fl (ref 78.0–100.0)
Monocytes Absolute: 0.6 10*3/uL (ref 0.1–1.0)
Monocytes Relative: 6.4 % (ref 3.0–12.0)
Neutro Abs: 4.4 10*3/uL (ref 1.4–7.7)
Neutrophils Relative %: 49.3 % (ref 43.0–77.0)
Platelets: 438 10*3/uL — ABNORMAL HIGH (ref 150.0–400.0)
RBC: 5.25 Mil/uL — ABNORMAL HIGH (ref 3.87–5.11)
RDW: 15.2 % (ref 11.5–15.5)
WBC: 8.9 10*3/uL (ref 4.0–10.5)

## 2021-04-23 LAB — HEMOGLOBIN A1C: Hgb A1c MFr Bld: 5.9 % (ref 4.6–6.5)

## 2021-04-23 LAB — TSH: TSH: 1.62 u[IU]/mL (ref 0.35–4.50)

## 2021-04-23 LAB — VITAMIN D 25 HYDROXY (VIT D DEFICIENCY, FRACTURES): VITD: 19.83 ng/mL — ABNORMAL LOW (ref 30.00–100.00)

## 2021-04-23 LAB — T4, FREE: Free T4: 0.71 ng/dL (ref 0.60–1.60)

## 2021-04-23 NOTE — Patient Instructions (Addendum)
Preventive Care 54-54 Years Old, Female Preventive care refers to lifestyle choices and visits with your health care provider that can promote health and wellness. This includes:  A yearly physical exam. This is also called an annual wellness visit.  Regular dental and eye exams.  Immunizations.  Screening for certain conditions.  Healthy lifestyle choices, such as: ? Eating a healthy diet. ? Getting regular exercise. ? Not using drugs or products that contain nicotine and tobacco. ? Limiting alcohol use. What can I expect for my preventive care visit? Physical exam Your health care provider will check your:  Height and weight. These may be used to calculate your BMI (body mass index). BMI is a measurement that tells if you are at a healthy weight.  Heart rate and blood pressure.  Body temperature.  Skin for abnormal spots. Counseling Your health care provider may ask you questions about your:  Past medical problems.  Family's medical history.  Alcohol, tobacco, and drug use.  Emotional well-being.  Home life and relationship well-being.  Sexual activity.  Diet, exercise, and sleep habits.  Work and work Statistician.  Access to firearms.  Method of birth control.  Menstrual cycle.  Pregnancy history. What immunizations do I need? Vaccines are usually given at various ages, according to a schedule. Your health care provider will recommend vaccines for you based on your age, medical history, and lifestyle or other factors, such as travel or where you work.   What tests do I need? Blood tests  Lipid and cholesterol levels. These may be checked every 5 years, or more often if you are over 3 years old.  Hepatitis C test.  Hepatitis B test. Screening  Lung cancer screening. You may have this screening every year starting at age 54 if you have a 30-pack-year history of smoking and currently smoke or have quit within the past 15 years.  Colorectal cancer  screening. ? All adults should have this screening starting at age 52 and continuing until age 17. ? Your health care provider may recommend screening at age 49 if you are at increased risk. ? You will have tests every 1-10 years, depending on your results and the type of screening test.  Diabetes screening. ? This is done by checking your blood sugar (glucose) after you have not eaten for a while (fasting). ? You may have this done every 1-3 years.  Mammogram. ? This may be done every 1-2 years. ? Talk with your health care provider about when you should start having regular mammograms. This may depend on whether you have a family history of breast cancer.  BRCA-related cancer screening. This may be done if you have a family history of breast, ovarian, tubal, or peritoneal cancers.  Pelvic exam and Pap test. ? This may be done every 3 years starting at age 10. ? Starting at age 11, this may be done every 5 years if you have a Pap test in combination with an HPV test. Other tests  STD (sexually transmitted disease) testing, if you are at risk.  Bone density scan. This is done to screen for osteoporosis. You may have this scan if you are at high risk for osteoporosis. Talk with your health care provider about your test results, treatment options, and if necessary, the need for more tests. Follow these instructions at home: Eating and drinking  Eat a diet that includes fresh fruits and vegetables, whole grains, lean protein, and low-fat dairy products.  Take vitamin and mineral supplements  as recommended by your health care provider.  Do not drink alcohol if: ? Your health care provider tells you not to drink. ? You are pregnant, may be pregnant, or are planning to become pregnant.  If you drink alcohol: ? Limit how much you have to 0-1 drink a day. ? Be aware of how much alcohol is in your drink. In the [place] one drink equals one 12 oz bottle of beer (355 mL), one 5 oz glass of  wine (148 mL), or one 1 oz glass of hard liquor (44 mL).   Lifestyle  Take daily care of your teeth and gums. Brush your teeth every morning and night with fluoride toothpaste. Floss one time each day.  Stay active. Exercise for at least 30 minutes 5 or more days each week.  Do not use any products that contain nicotine or tobacco, such as cigarettes, e-cigarettes, and chewing tobacco. If you need help quitting, ask your health care provider.  Do not use drugs.  If you are sexually active, practice safe sex. Use a condom or other form of protection to prevent STIs (sexually transmitted infections).  If you do not wish to become pregnant, use a form of birth control. If you plan to become pregnant, see your health care provider for a prepregnancy visit.  If told by your health care provider, take low-dose aspirin daily starting at age 54.  Find healthy ways to cope with stress, such as: ? Meditation, yoga, or listening to music. ? Journaling. ? Talking to a trusted person. ? Spending time with friends and family. Safety  Always wear your seat belt while driving or riding in a vehicle.  Do not drive: ? If you have been drinking alcohol. Do not ride with someone who has been drinking. ? When you are tired or distracted. ? While texting.  Wear a helmet and other protective equipment during sports activities.  If you have firearms in your house, make sure you follow all gun safety procedures. What's next?  Visit your health care provider once a year for an annual wellness visit.  Ask your health care provider how often you should have your eyes and teeth checked.  Stay up to date on all vaccines. This information is not intended to replace advice given to you by your health care provider. Make sure you discuss any questions you have with your health care provider. Document Revised: 08/08/2020 Document Reviewed: 07/16/2018 Elsevier Patient Education  2021 [place] [employer] Calories are units of energy. Your body needs a certain number of calories from food to keep going throughout the day. When you eat or drink more calories than your body needs, your body stores the extra calories mostly as fat. When you eat or drink fewer calories than your body needs, your body burns fat to get the energy it needs. Calorie counting means keeping track of how many calories you eat and drink each day. Calorie counting can be helpful if you need to lose weight. If you eat fewer calories than your body needs, you should lose weight. Ask your health care provider what a healthy weight is for you. For calorie counting to work, you will need to eat the right number of calories each day to lose a healthy amount of weight per week. A dietitian can help you figure out how many calories you need in a day and will suggest ways to reach your calorie goal.  A healthy amount of  weight to lose each week is usually 1-2 lb (0.5-0.9 kg). This usually means that your daily calorie intake should be reduced by 500-750 calories.  Eating 1,200-1,500 calories a day can help most women lose weight.  Eating 1,500-1,800 calories a day can help most men lose weight. What do I need to know about calorie counting? Work with your health care provider or dietitian to determine how many calories you should get each day. To meet your daily calorie goal, you will need to:  Find out how many calories are in each food that you would like to eat. Try to do this before you eat.  Decide how much of the food you plan to eat.  Keep a food log. Do this by writing down what you ate and how many calories it had. To successfully lose weight, it is important to balance calorie counting with a healthy lifestyle that includes regular activity. Where do I find calorie information? The number of calories in a food can be found on a Nutrition Facts label. If a food does not have a Nutrition  Facts label, try to look up the calories online or ask your dietitian for help. Remember that calories are listed per serving. If you choose to have more than one serving of a food, you will have to multiply the calories per serving by the number of servings you plan to eat. For example, the label on a package of bread might say that a serving size is 1 slice and that there are 90 calories in a serving. If you eat 1 slice, you will have eaten 90 calories. If you eat 2 slices, you will have eaten 180 calories.   How do I keep a food log? After each time that you eat, record the following in your food log as soon as possible:  What you ate. Be sure to include toppings, sauces, and other extras on the food.  How much you ate. This can be measured in cups, ounces, or number of items.  How many calories were in each food and drink.  The total number of calories in the food you ate. Keep your food log near you, such as in a pocket-sized notebook or on an app or website on your mobile phone. Some programs will calculate calories for you and show you how many calories you have left to meet your daily goal. What are some portion-control tips?  Know how many calories are in a serving. This will help you know how many servings you can have of a certain food.  Use a measuring cup to measure serving sizes. You could also try weighing out portions on a kitchen scale. With time, you will be able to estimate serving sizes for some foods.  Take time to put servings of different foods on your favorite plates or in your favorite bowls and cups so you know what a serving looks like.  Try not to eat straight from a food's packaging, such as from a bag or box. Eating straight from the package makes it hard to see how much you are eating and can lead to overeating. Put the amount you would like to eat in a cup or on a plate to make sure you are eating the right portion.  Use smaller plates, glasses, and bowls for  smaller portions and to prevent overeating.  Try not to multitask. For example, avoid watching TV or using your computer while eating. If it is time to eat, sit  down at a table and enjoy your food. This will help you recognize when you are full. It will also help you be more mindful of what and how much you are eating. What are tips for following this plan? Reading food labels  Check the calorie count compared with the serving size. The serving size may be smaller than what you are used to eating.  Check the source of the calories. Try to choose foods that are high in protein, fiber, and vitamins, and low in saturated fat, trans fat, and sodium. Shopping  Read nutrition labels while you shop. This will help you make healthy decisions about which foods to buy.  Pay attention to nutrition labels for low-fat or fat-free foods. These foods sometimes have the same number of calories or more calories than the full-fat versions. They also often have added sugar, starch, or salt to make up for flavor that was removed with the fat.  Make a grocery list of lower-calorie foods and stick to it. Cooking  Try to cook your favorite foods in a healthier way. For example, try baking instead of frying.  Use low-fat dairy products. Meal planning  Use more fruits and vegetables. One-half of your plate should be fruits and vegetables.  Include lean proteins, such as chicken, [place], and fish. Lifestyle Each week, aim to do one of the following:  150 minutes of moderate exercise, such as walking.  75 minutes of vigorous exercise, such as running. General information  Know how many calories are in the foods you eat most often. This will help you calculate calorie counts faster.  Find a way of tracking calories that works for you. Get creative. Try different apps or programs if writing down calories does not work for you. What foods should I eat?  Eat nutritious foods. It is better to have a  nutritious, high-calorie food, such as an avocado, than a food with few nutrients, such as a bag of potato chips.  Use your calories on foods and drinks that will fill you up and will not leave you hungry soon after eating. ? Examples of foods that fill you up are nuts and nut butters, vegetables, lean proteins, and high-fiber foods such as whole grains. High-fiber foods are foods with more than 5 g of fiber per serving.  Pay attention to calories in drinks. Low-calorie drinks include water and unsweetened drinks. The items listed above may not be a complete list of foods and beverages you can eat. Contact a dietitian for more information.   What foods should I limit? Limit foods or drinks that are not good sources of vitamins, minerals, or protein or that are high in unhealthy fats. These include:  Candy.  Other sweets.  Sodas, specialty coffee drinks, alcohol, and juice. The items listed above may not be a complete list of foods and beverages you should avoid. Contact a dietitian for more information. How do I count calories when eating out?  Pay attention to portions. Often, portions are much larger when eating out. Try these tips to keep portions smaller: ? Consider sharing a meal instead of getting your own. ? If you get your own meal, eat only half of it. Before you start eating, ask for a container and put half of your meal into it. ? When available, consider ordering smaller portions from the menu instead of full portions.  Pay attention to your food and drink choices. Knowing the way food is cooked and what is included with the  meal can help you eat fewer calories. ? If calories are listed on the menu, choose the lower-calorie options. ? Choose dishes that include vegetables, fruits, whole grains, low-fat dairy products, and lean proteins. ? Choose items that are boiled, broiled, grilled, or steamed. Avoid items that are buttered, battered, fried, or served with cream sauce. Items  labeled as crispy are usually fried, unless stated otherwise. ? Choose water, low-fat milk, unsweetened iced tea, or other drinks without added sugar. If you want an alcoholic beverage, choose a lower-calorie option, such as a glass of wine or light beer. ? Ask for dressings, sauces, and syrups on the side. These are usually high in calories, so you should limit the amount you eat. ? If you want a salad, choose a garden salad and ask for grilled meats. Avoid extra toppings such as bacon, cheese, or fried items. Ask for the dressing on the side, or ask for olive oil and vinegar or lemon to use as dressing.  Estimate how many servings of a food you are given. Knowing serving sizes will help you be aware of how much food you are eating at restaurants. Where to find more information  Centers for Disease Control and Prevention: http://www.wolf.info/  [place] Department of Agriculture: http://www.wilson-mendoza.org/ Summary  Calorie counting means keeping track of how many calories you eat and drink each day. If you eat fewer calories than your body needs, you should lose weight.  A healthy amount of weight to lose per week is usually 1-2 lb (0.5-0.9 kg). This usually means reducing your daily calorie intake by 500-750 calories.  The number of calories in a food can be found on a Nutrition Facts label. If a food does not have a Nutrition Facts label, try to look up the calories online or ask your dietitian for help.  Use smaller plates, glasses, and bowls for smaller portions and to prevent overeating.  Use your calories on foods and drinks that will fill you up and not leave you hungry shortly after a meal. This information is not intended to replace advice given to you by your health care provider. Make sure you discuss any questions you have with your health care provider. Document Revised: 12/16/2019 Document Reviewed: 12/16/2019 Elsevier Patient Education  2021 [place].  Exercising to Lose Weight Exercise is  structured, repetitive physical activity to improve fitness and health. Getting regular exercise is important for everyone. It is especially important if you are overweight. Being overweight increases your risk of heart disease, stroke, diabetes, high blood pressure, and several types of cancer. Reducing your calorie intake and exercising can help you lose weight. Exercise is usually categorized as moderate or vigorous intensity. To lose weight, most people need to do a certain amount of moderate-intensity or vigorous-intensity exercise each week. Moderate-intensity exercise Moderate-intensity exercise is any activity that gets you moving enough to burn at least three times more energy (calories) than if you were sitting. Examples of moderate exercise include:  Walking a mile in 15 minutes.  Doing light yard work.  Biking at an easy pace. Most people should get at least 150 minutes (2 hours and 30 minutes) a week of moderate-intensity exercise to maintain their body weight.   Vigorous-intensity exercise Vigorous-intensity exercise is any activity that gets you moving enough to burn at least six times more calories than if you were sitting. When you exercise at this intensity, you should be working hard enough that you are not able to carry on a conversation. Examples  of vigorous exercise include:  Running.  Playing a team sport, such as football, basketball, and soccer.  Jumping rope. Most people should get at least 75 minutes (1 hour and 15 minutes) a week of vigorous-intensity exercise to maintain their body weight. How can exercise affect me? When you exercise enough to burn more calories than you eat, you lose weight. Exercise also reduces body fat and builds muscle. The more muscle you have, the more calories you burn. Exercise also:  Improves mood.  Reduces stress and tension.  Improves your overall fitness, flexibility, and endurance.  Increases bone strength. The amount of  exercise you need to lose weight depends on:  Your age.  The type of exercise.  Any health conditions you have.  Your overall physical ability. Talk to your health care provider about how much exercise you need and what types of activities are safe for you. What actions can I take to lose weight? Nutrition  Make changes to your diet as told by your health care provider or diet and nutrition specialist (dietitian). This may include: ? Eating fewer calories. ? Eating more protein. ? Eating less unhealthy fats. ? Eating a diet that includes fresh fruits and vegetables, whole grains, low-fat dairy products, and lean protein. ? Avoiding foods with added fat, salt, and sugar.  Drink plenty of water while you exercise to prevent dehydration or heat stroke.   Activity  Choose an activity that you enjoy and set realistic goals. Your health care provider can help you make an exercise plan that works for you.  Exercise at a moderate or vigorous intensity most days of the week. ? The intensity of exercise may vary from person to person. You can tell how intense a workout is for you by paying attention to your breathing and heartbeat. Most people will notice their breathing and heartbeat get faster with more intense exercise.  Do resistance training twice each week, such as: ? Push-ups. ? Sit-ups. ? Lifting weights. ? Using resistance bands.  Getting short amounts of exercise can be just as helpful as long structured periods of exercise. If you have trouble finding time to exercise, try to include exercise in your daily routine. ? Get up, stretch, and walk around every 30 minutes throughout the day. ? Go for a walk during your lunch break. ? Park your car farther away from your destination. ? If you take public transportation, get off one stop early and walk the rest of the way. ? Make phone calls while standing up and walking around. ? Take the stairs instead of elevators or  escalators.  Wear comfortable clothes and shoes with good support.  Do not exercise so much that you hurt yourself, feel dizzy, or get very short of breath. Where to find more information  [place]. Department of Health and Human Services: [employer].at  Centers for Disease Control and Prevention (CDC): http://www.wolf.info/ Contact a health care provider:  Before starting a new exercise program.  If you have questions or concerns about your weight.  If you have a medical problem that keeps you from exercising. Get help right away if you have any of the following while exercising:  Injury.  Dizziness.  Difficulty breathing or shortness of breath that does not go away when you stop exercising.  Chest pain.  Rapid heartbeat. Summary  Being overweight increases your risk of heart disease, stroke, diabetes, high blood pressure, and several types of cancer.  Losing weight happens when you burn more calories than you  eat.  Reducing the amount of calories you eat in addition to getting regular moderate or vigorous exercise each week helps you lose weight. This information is not intended to replace advice given to you by your health care provider. Make sure you discuss any questions you have with your health care provider. Document Revised: 03/02/2020 Document Reviewed: 03/02/2020 Elsevier Patient Education  2021 [place].  Insomnia Insomnia is a sleep disorder that makes it difficult to fall asleep or stay asleep. Insomnia can cause fatigue, low energy, difficulty concentrating, mood swings, and poor performance at work or school. There are three different ways to classify insomnia:  Difficulty falling asleep.  Difficulty staying asleep.  Waking up too early in the morning. Any type of insomnia can be long-term (chronic) or short-term (acute). Both are common. Short-term insomnia usually lasts for three months or less. Chronic insomnia occurs at least three times a week for longer than  three months. What are the causes? Insomnia may be caused by another condition, situation, or substance, such as:  Anxiety.  Certain medicines.  Gastroesophageal reflux disease (GERD) or other gastrointestinal conditions.  Asthma or other breathing conditions.  Restless legs syndrome, sleep apnea, or other sleep disorders.  Chronic pain.  Menopause.  Stroke.  Abuse of alcohol, tobacco, or illegal drugs.  Mental health conditions, such as depression.  Caffeine.  Neurological disorders, such as Alzheimer's disease.  An overactive thyroid (hyperthyroidism). Sometimes, the cause of insomnia may not be known. What increases the risk? Risk factors for insomnia include:  Gender. Women are affected more often than men.  Age. Insomnia is more common as you get older.  Stress.  Lack of exercise.  Irregular work schedule or working night shifts.  Traveling between different time zones.  Certain medical and mental health conditions. What are the signs or symptoms? If you have insomnia, the main symptom is having trouble falling asleep or having trouble staying asleep. This may lead to other symptoms, such as:  Feeling fatigued or having low energy.  Feeling nervous about going to sleep.  Not feeling rested in the morning.  Having trouble concentrating.  Feeling irritable, anxious, or depressed. How is this diagnosed? This condition may be diagnosed based on:  Your symptoms and medical history. Your health care provider may ask about: ? Your sleep habits. ? Any medical conditions you have. ? Your mental health.  A physical exam. How is this treated? Treatment for insomnia depends on the cause. Treatment may focus on treating an underlying condition that is causing insomnia. Treatment may also include:  Medicines to help you sleep.  Counseling or therapy.  Lifestyle adjustments to help you sleep better. Follow these instructions at home: Eating and  drinking  Limit or avoid alcohol, caffeinated beverages, and cigarettes, especially close to bedtime. These can disrupt your sleep.  Do not eat a large meal or eat spicy foods right before bedtime. This can lead to digestive discomfort that can make it hard for you to sleep.   Sleep habits  Keep a sleep diary to help you and your health care provider figure out what could be causing your insomnia. Write down: ? When you sleep. ? When you wake up during the night. ? How well you sleep. ? How rested you feel the next day. ? Any side effects of medicines you are taking. ? What you eat and drink.  Make your bedroom a dark, comfortable place where it is easy to fall asleep. ? Put up shades or  blackout curtains to block light from outside. ? Use a white noise machine to block noise. ? Keep the temperature cool.  Limit screen use before bedtime. This includes: ? Watching TV. ? Using your smartphone, tablet, or computer.  Stick to a routine that includes going to bed and waking up at the same times every day and night. This can help you fall asleep faster. Consider making a quiet activity, such as reading, part of your nighttime routine.  Try to avoid taking naps during the day so that you sleep better at night.  Get out of bed if you are still awake after 15 minutes of trying to sleep. Keep the lights down, but try reading or doing a quiet activity. When you feel sleepy, go back to bed.   General instructions  Take over-the-counter and prescription medicines only as told by your health care provider.  Exercise regularly, as told by your health care provider. Avoid exercise starting several hours before bedtime.  Use relaxation techniques to manage stress. Ask your health care provider to suggest some techniques that may work well for you. These may include: ? Breathing exercises. ? Routines to release muscle tension. ? Visualizing peaceful scenes.  Make sure that you drive carefully.  Avoid driving if you feel very sleepy.  Keep all follow-up visits as told by your health care provider. This is important. Contact a health care provider if:  You are tired throughout the day.  You have trouble in your daily routine due to sleepiness.  You continue to have sleep problems, or your sleep problems get worse. Get help right away if:  You have serious thoughts about hurting yourself or someone else. If you ever feel like you may hurt yourself or others, or have thoughts about taking your own life, get help right away. You can go to your nearest emergency department or call:  Your local emergency services (911 in the [place]  A suicide crisis helpline, such as the [place] at 239-827-2256. This is open 24 hours a day. Summary  Insomnia is a sleep disorder that makes it difficult to fall asleep or stay asleep.  Insomnia can be long-term (chronic) or short-term (acute).  Treatment for insomnia depends on the cause. Treatment may focus on treating an underlying condition that is causing insomnia.  Keep a sleep diary to help you and your health care provider figure out what could be causing your insomnia. This information is not intended to replace advice given to you by your health care provider. Make sure you discuss any questions you have with your health care provider. Document Revised: 09/14/2020 Document Reviewed: 09/14/2020 Elsevier Patient Education  2021 [employer].

## 2021-04-23 NOTE — Progress Notes (Signed)
Subjective:     Patricia Pineda is a 55 y.o. female and is here for a comprehensive physical exam.  Pt seen by Pulm, .  Pt feels like she is having wheezing at night despite advair and albuterol prn.  Pt having continued L inguinal pain, seen by OB/Gyn.  Has not had an u/s.  Pt having hot flashes and difficulty sleeping.  Trying to lose weight.  Social History   Socioeconomic History   Marital status: Married    Spouse name: Not on file   Number of children: Not on file   Years of education: Not on file   Highest education level: Not on file  Occupational History   Occupation: mental health supervisor  Tobacco Use   Smoking status: Never Smoker   Smokeless tobacco: Never Used  Vaping Use   Vaping Use: Never used  Substance and Sexual Activity   Alcohol use: Yes    Alcohol/week: 1.0 standard drink    Types: 1 Glasses of wine per week    Comment: occassionally   Drug use: No   Sexual activity: Yes    Partners: Female    Birth control/protection: Surgical  Other Topics Concern   Not on file  Social History Narrative   58 year old daughter and 26 year old son   Social Determinants of Radio broadcast assistant Strain: Not on file  Food Insecurity: Not on file  Transportation Needs: Not on file  Physical Activity: Not on file  Stress: Not on file  Social Connections: Not on file  Intimate Partner Violence: Not on file   Health Maintenance  Topic Date Due   Pneumococcal Vaccine 35-83 Years old (1 of 2 - PPSV23) Never done   HIV Screening  Never done   Hepatitis C Screening  Never done   Zoster Vaccines- Shingrix (1 of 2) Never done   MAMMOGRAM  06/16/2020   COVID-19 Vaccine (3 - Booster for Pfizer series) 07/11/2020   PAP SMEAR-Modifier  06/16/2021   INFLUENZA VACCINE  06/18/2021   TETANUS/TDAP  03/18/2022   COLONOSCOPY (Pts 45-79yrs Insurance coverage will need to be confirmed)  12/25/2023   HPV VACCINES  Aged Out    The following portions of the patient's history  were reviewed and updated as appropriate: allergies, current medications, past family history, past medical history, past social history, past surgical history and problem list.  Review of Systems Pertinent items noted in HPI and remainder of comprehensive ROS otherwise negative.   Objective:    BP 124/78 (BP Location: Left Arm, Patient Position: Sitting, Cuff Size: Large)   Pulse 82   Temp 98.8 F (37.1 C) (Oral)   Ht 5\' 6"  (1.676 m)   Wt 245 lb 9.6 oz (111.4 kg)   LMP 08/17/2016 (Exact Date)   SpO2 98%   BMI 39.64 kg/m  General appearance: alert, cooperative, and no distress Head: Normocephalic, without obvious abnormality, atraumatic Eyes: conjunctivae/corneas clear. PERRL, EOM's intact. Fundi benign. Ears: normal TM's and external ear canals both ears Nose: Nares normal. Septum midline. Mucosa normal. No drainage or sinus tenderness. Throat: lips, mucosa, and tongue normal; teeth and gums normal Neck: no adenopathy, no carotid bruit, no JVD, supple, symmetrical, trachea midline, and thyroid not enlarged, symmetric, no tenderness/mass/nodules Lungs: clear to auscultation bilaterally Heart: regular rate and rhythm, S1, S2 normal, no murmur, click, rub or gallop Abdomen: soft, non-tender; bowel sounds normal; no masses,  no organomegaly Extremities: extremities normal, atraumatic, no cyanosis or edema Pulses: 2+  and symmetric Skin: Skin color, texture, turgor normal. No rashes or lesions Lymph nodes: Cervical, supraclavicular, and axillary nodes normal. Neurologic: Alert and oriented X 3, normal strength and tone. Normal symmetric reflexes. Normal coordination and gait    Assessment:    Healthy female exam      Plan:     Anticipatory guidance given including wearing seatbelts, smoke detectors in the home, increasing physical activity, increasing p.o. intake of water and vegetables. -We will obtain labs -Mammogram done 03/31/2021 fibrocystic change with adenosis -Pap  up-to-date with OB/GYN 01/29/2021 -Colonoscopy done 12/24/2018 -given handout -next CPE in 1 yr See After Visit Summary for Counseling Recommendations   Left inguinal pain  - Plan: CBC with Differential/Platelet, CMP, CT Abdomen Pelvis Wo Contrast  Class 2 severe obesity due to excess calories with serious comorbidity and body mass index (BMI) of 39.0 to 39.9 in adult (HCC)  -Lifestyle modifications encouraged including increasing physical activity, decreasing portion sizes -given handouts - Plan: TSH, T4, Free, Hemoglobin A1c, Vitamin D, 25-hydroxy  Mixed hyperlipidemia  -lifestyle modifications - Plan: Lipid panel  Vitamin D deficiency  - Plan: Vitamin D, 25-hydroxy  Insomnia, unspecified type -likely multifactorial.  Hot flashes contributing. -sleep hygiene -given handout  Hot flashes -consider OTC black coash or estroven  -for continued symptoms consider gabapentin -f/u with OB/Gyn  Moderate persistent asthma without complication -continue albuterol inhaler prn and advair -f/u with Pulm.  Will place new referral if needed.   F/u prn   Grier Mitts, MD

## 2021-05-01 ENCOUNTER — Other Ambulatory Visit: Payer: Self-pay | Admitting: Family Medicine

## 2021-05-01 DIAGNOSIS — E559 Vitamin D deficiency, unspecified: Secondary | ICD-10-CM

## 2021-05-01 MED ORDER — VITAMIN D (ERGOCALCIFEROL) 1.25 MG (50000 UNIT) PO CAPS: 50000.0000 [IU] | ORAL_CAPSULE | ORAL | 0 refills | Status: DC

## 2021-05-09 ENCOUNTER — Telehealth: Payer: Self-pay | Admitting: Family Medicine

## 2021-05-09 NOTE — Telephone Encounter (Signed)
Pt call and stated she tested positive for Covid and want a antibiotic  and want a call back to lell her what she need to do.

## 2021-05-10 NOTE — Telephone Encounter (Signed)
Patient called yesterday and is wanting a call back--she tested positive for Covid and is wanting to know if she whould be taking an antibiotic or anything or what she needs to do.

## 2021-05-11 ENCOUNTER — Other Ambulatory Visit: Payer: Managed Care, Other (non HMO)

## 2021-05-28 ENCOUNTER — Ambulatory Visit (INDEPENDENT_AMBULATORY_CARE_PROVIDER_SITE_OTHER)
Admission: RE | Admit: 2021-05-28 | Discharge: 2021-05-28 | Disposition: A | Discharge status: Home / Self Care | Payer: Managed Care, Other (non HMO) | Source: Ambulatory Visit | Attending: Family Medicine | Admitting: Family Medicine

## 2021-05-28 ENCOUNTER — Other Ambulatory Visit: Payer: Self-pay

## 2021-05-28 DIAGNOSIS — R1032 Left lower quadrant pain: Secondary | ICD-10-CM | POA: Diagnosis not present

## 2021-05-29 ENCOUNTER — Other Ambulatory Visit: Payer: Self-pay | Admitting: Family Medicine

## 2021-05-29 ENCOUNTER — Telehealth: Payer: Self-pay

## 2021-05-29 NOTE — Telephone Encounter (Signed)
CRITICAL VALUE STICKER  CRITICAL VALUE: CT ABDOMEN PELVIS note #3  RECEIVER (on-site recipient of call): Maui Britten  DATE & TIME NOTIFIED: 05/29/21 8:55 am  MESSENGER (representative from lab): Diane  MD NOTIFIED: Lockie Pares  TIME OF NOTIFICATION: 9:00 am  RESPONSE:

## 2021-05-30 ENCOUNTER — Telehealth: Payer: Self-pay | Admitting: Family Medicine

## 2021-05-30 DIAGNOSIS — N2889 Other specified disorders of kidney and ureter: Secondary | ICD-10-CM

## 2021-05-30 DIAGNOSIS — K760 Fatty (change of) liver, not elsewhere classified: Secondary | ICD-10-CM

## 2021-05-30 NOTE — Telephone Encounter (Signed)
Pt called regarding CT abd pelvis results.  Hepatic steatosis with hepatomegaly.  LFTs normal on 04/23/21.  Will place referral to GI.  Small 1 cm lesion on upper pole of R kidney, cyst vs small solid renal neoplasm.  Will order additional imaging to further evaluate.  Pt inquires about open scanner as she is claustrophobic.  Small hiatal hernia noted.  Pt inquires if the hiatal hernia would keep her from getting gastric sleeve procedure.  Grier Mitts, MD

## 2021-05-31 NOTE — Telephone Encounter (Signed)
Provider was out of office 05/29/21.  Pt aware of CT results.  See telephone call.

## 2021-06-03 ENCOUNTER — Other Ambulatory Visit: Payer: Self-pay | Admitting: Family Medicine

## 2021-06-03 DIAGNOSIS — R059 Cough, unspecified: Secondary | ICD-10-CM

## 2021-06-14 ENCOUNTER — Telehealth: Payer: Self-pay | Admitting: Family Medicine

## 2021-06-14 NOTE — Telephone Encounter (Signed)
Patient says that she has an MRI scheduled on Monday, and was told by a representative from the location she is having the MRI that she should contact her PCP to see if a medication can be prescribed for her to help her stay calm due to her claustrophobia.  Patient says that Dr. Volanda Napoleon has record of her having claustrophobia and would like something prescribed for her MRI.  Patient uses [facility] [id number] - [place], [address]  Please advise.

## 2021-06-15 ENCOUNTER — Other Ambulatory Visit: Payer: Self-pay | Admitting: Family Medicine

## 2021-06-15 DIAGNOSIS — F40240 Claustrophobia: Secondary | ICD-10-CM

## 2021-06-15 DIAGNOSIS — I10 Essential (primary) hypertension: Secondary | ICD-10-CM

## 2021-06-15 MED ORDER — LORAZEPAM 1 MG PO TABS: ORAL_TABLET | ORAL | 0 refills | Status: DC

## 2021-06-18 ENCOUNTER — Other Ambulatory Visit: Payer: Self-pay

## 2021-06-18 ENCOUNTER — Ambulatory Visit
Admission: RE | Admit: 2021-06-18 | Discharge: 2021-06-18 | Disposition: A | Discharge status: Home / Self Care | Payer: Managed Care, Other (non HMO) | Source: Ambulatory Visit | Attending: Family Medicine | Admitting: Family Medicine

## 2021-06-18 DIAGNOSIS — N2889 Other specified disorders of kidney and ureter: Secondary | ICD-10-CM

## 2021-06-18 NOTE — Telephone Encounter (Signed)
Medication was sent in on 7/29.

## 2021-07-02 ENCOUNTER — Other Ambulatory Visit: Payer: Managed Care, Other (non HMO)

## 2021-07-02 ENCOUNTER — Telehealth: Payer: Self-pay | Admitting: Family Medicine

## 2021-07-02 ENCOUNTER — Ambulatory Visit
Admission: RE | Admit: 2021-07-02 | Discharge: 2021-07-02 | Disposition: A | Discharge status: Home / Self Care | Payer: Managed Care, Other (non HMO) | Source: Ambulatory Visit | Attending: Family Medicine | Admitting: Family Medicine

## 2021-07-02 MED ORDER — GADOBENATE DIMEGLUMINE 529 MG/ML IV SOLN
15.0000 mL | Freq: Once | INTRAVENOUS | Status: AC | PRN
  Administered 2021-07-02: 15 mL via INTRAVENOUS

## 2021-07-02 NOTE — Telephone Encounter (Signed)
Pt call and stated she need something for anxiety call for her today because she is having a mri today and need it before her mir.

## 2021-07-03 NOTE — Telephone Encounter (Signed)
Was unable to get pt something before her MRI.

## 2021-07-10 NOTE — Telephone Encounter (Signed)
Pt has question about what it means, she read the result not from Dr Volanda Napoleon, but has additional questions. Scheduled a virtual visit for 8/24.

## 2021-07-11 ENCOUNTER — Telehealth (INDEPENDENT_AMBULATORY_CARE_PROVIDER_SITE_OTHER): Payer: Managed Care, Other (non HMO) | Admitting: Family Medicine

## 2021-07-11 ENCOUNTER — Encounter: Payer: Self-pay | Admitting: Family Medicine

## 2021-07-11 DIAGNOSIS — K760 Fatty (change of) liver, not elsewhere classified: Secondary | ICD-10-CM

## 2021-07-11 DIAGNOSIS — R935 Abnormal findings on diagnostic imaging of other abdominal regions, including retroperitoneum: Secondary | ICD-10-CM

## 2021-07-11 DIAGNOSIS — N281 Cyst of kidney, acquired: Secondary | ICD-10-CM | POA: Diagnosis not present

## 2021-07-11 NOTE — Progress Notes (Signed)
Virtual Visit via Video Note  I connected with Patricia Pineda on 07/11/21 at  3:30 PM EDT by a video enabled telemedicine application 2/2 [id number] pandemic and verified that I am speaking with the correct person using two identifiers.  Location patient: home Location provider:work or home office Persons participating in the virtual visit: patient, provider  I discussed the limitations of evaluation and management by telemedicine and the availability of in person appointments. The patient expressed understanding and agreed to proceed.   HPI: Pt seen for f/u on MRI results.  MRI abd with hepatic steatosis and cirrhosis.  Several renal cyst also noted.    Pt had to reschedule the MRI as she has claustrophobia and was initially scheduled for the smaller machine.  Pt's son Thomasena Edis recently returned.   ROS: See pertinent positives and negatives per HPI.  Past Medical History:  Diagnosis Date   Anemia    Arthritis    knees   High cholesterol    Hypertension    Pneumonia     Past Surgical History:  Procedure Laterality Date   ABDOMINAL HYSTERECTOMY  09/17/2016   CESAREAN SECTION     HYSTERECTOMY ABDOMINAL WITH SALPINGECTOMY Bilateral 09/17/2016   Procedure: HYSTERECTOMY ABDOMINAL WITH BILATERAL SALPINGECTOMY, LEFT OOPHORECTOMY;  Surgeon: Marylynn Pearson, MD;  Location: [facility];  Service: Gynecology;  Laterality: Bilateral;    Family History  Problem Relation Age of Onset   Hypertension Father    Prostate cancer Father    Heart attack Maternal Grandmother        early 31s   Arthritis Paternal Grandmother    Asthma Child    Colon cancer Neg Hx    Esophageal cancer Neg Hx    Rectal cancer Neg Hx    Stomach cancer Neg Hx      Current Outpatient Medications:    albuterol (VENTOLIN HFA) 108 (90 Base) MCG/ACT inhaler, INHALE TWO PUFFS BY MOUTH EVERY 6 HOURS AS NEEDED FOR WHEEZING OR SHORTNESS OF BREATH, Disp: 8.5 g, Rfl: 0   amLODipine (NORVASC) 10 MG tablet, TAKE ONE TABLET BY MOUTH  DAILY, Disp: 90 tablet, Rfl: 0   EPINEPHrine 0.3 mg/0.3 mL IJ SOAJ injection, Inject 0.3 mLs (0.3 mg total) into the muscle as needed for anaphylaxis., Disp: 2 each, Rfl: 2   famotidine (PEPCID) 20 MG tablet, Take 20 mg by mouth at bedtime., Disp: , Rfl:    Fluticasone-Salmeterol (ADVAIR DISKUS) 250-50 MCG/DOSE AEPB, Inhale 1 puff into the lungs in the morning and at bedtime., Disp: 60 each, Rfl: 5   LORazepam (ATIVAN) 1 MG tablet, Take one tablet 30 minutes prior to imaging study., Disp: 1 tablet, Rfl: 0   meloxicam (MOBIC) 15 MG tablet, Take 15 mg by mouth as needed for pain., Disp: , Rfl:    montelukast (SINGULAIR) 10 MG tablet, TAKE ONE TABLET BY MOUTH EVERY NIGHT AT BEDTIME, Disp: 30 tablet, Rfl: 2   simvastatin (ZOCOR) 10 MG tablet, TAKE 1 TABLET BY MOUTH THREE TIMES A WEEK, Disp: 38 tablet, Rfl: 2   Vitamin D, Ergocalciferol, (DRISDOL) 1.25 MG (50000 UNIT) CAPS capsule, Take 1 capsule (50,000 Units total) by mouth every 7 (seven) days., Disp: 12 capsule, Rfl: 0  Current Facility-Administered Medications:    0.9 %  sodium chloride infusion, 500 mL, Intravenous, Once, Armbruster, Carlota Raspberry, MD  EXAM:  VITALS per patient if applicable: RR between 123456 bpm  GENERAL: alert, oriented, appears well and in no acute distress  HEENT: atraumatic, conjunctiva clear, no obvious abnormalities on inspection  of external nose and ears  NECK: normal movements of the head and neck  LUNGS: on inspection no signs of respiratory distress, breathing rate appears normal, no obvious gross SOB, gasping or wheezing  CV: no obvious cyanosis  MS: moves all visible extremities without noticeable abnormality  PSYCH/NEURO: pleasant and cooperative, no obvious depression or anxiety, speech and thought processing grossly intact  ASSESSMENT AND PLAN:  Discussed the following assessment and plan:  Hepatic steatosis -LFTs normal 04/23/2021 -Hepatic steatosis and cirrhosis noted on MRI from 07/02/2021.  Questions  answered to satisfaction. -Discussed the importance of lifestyle modifications -Follow-up with GI  Renal cysts, acquired, bilateral  -Benign cyst seen in both kidneys. -1 cm cyst in right kidney difficult to characterize 2/2 size and location.  Repeat MRI in 6 months with renal protocol recommended to reassess. -Order placed this visit.  Repeat scan expected on around 01/02/2022. - Plan: MR ABDOMEN W WO CONTRAST  Abnormal MRI of abdomen  Follow-up as needed   I discussed the assessment and treatment plan with the patient. The patient was provided an opportunity to ask questions and all were answered. The patient agreed with the plan and demonstrated an understanding of the instructions.   The patient was advised to call back or seek an in-person evaluation if the symptoms worsen or if the condition fails to improve as anticipated.  Billie Ruddy, MD

## 2021-07-16 ENCOUNTER — Telehealth: Payer: Self-pay | Admitting: Family Medicine

## 2021-07-16 NOTE — Telephone Encounter (Signed)
Insurance company denying this request for The MrI    The patient insurance is requesting additional information and would like the  [place] or Doctor to Call: [phone] to give more clinical information on why this is medically necessary to have the  [place] [id number]  Patricia Pineda, [name]  [birth year]-Member [id number] ID: [id number]

## 2021-08-08 ENCOUNTER — Other Ambulatory Visit: Payer: Self-pay | Admitting: Family Medicine

## 2021-08-08 DIAGNOSIS — E559 Vitamin D deficiency, unspecified: Secondary | ICD-10-CM

## 2021-10-09 ENCOUNTER — Ambulatory Visit: Payer: Managed Care, Other (non HMO) | Admitting: Gastroenterology

## 2021-10-09 ENCOUNTER — Encounter: Payer: Self-pay | Admitting: Family Medicine

## 2021-10-15 ENCOUNTER — Other Ambulatory Visit: Payer: Self-pay | Admitting: Family Medicine

## 2021-10-15 DIAGNOSIS — I10 Essential (primary) hypertension: Secondary | ICD-10-CM

## 2021-10-22 ENCOUNTER — Other Ambulatory Visit: Payer: Self-pay | Admitting: Internal Medicine

## 2021-10-22 DIAGNOSIS — J4540 Moderate persistent asthma, uncomplicated: Secondary | ICD-10-CM

## 2021-10-23 ENCOUNTER — Telehealth: Payer: Self-pay | Admitting: Internal Medicine

## 2021-10-23 MED ORDER — FLUTICASONE-SALMETEROL 250-50 MCG/ACT IN AEPB: 1.0000 | INHALATION_SPRAY | Freq: Two times a day (BID) | RESPIRATORY_TRACT | 5 refills | Status: DC

## 2021-10-23 NOTE — Telephone Encounter (Signed)
Rx for pt's Advair has been sent to preferred pharmacy for pt.  Attempted to call pt twice to let her know this had been done but each time when trying to call pt, line rang twice but then went to a busy signal. Sent pt a mychart message to let her know that the Rx had been sent. Nothing further needed.

## 2021-12-04 ENCOUNTER — Telehealth: Payer: Self-pay | Admitting: Family Medicine

## 2021-12-04 NOTE — Telephone Encounter (Signed)
Patient calling in with respiratory symptoms: Shortness of breath, chest pain, palpitations or other red words send to Triage  Does the patient have a fever over 100, cough, congestion, sore throat, runny nose, lost of taste/smell (please list symptoms that patient has)?fatigue  What date did symptoms start? (If over 5 days ago, pt may be scheduled for in person visit)  Have you tested for Covid in the last 5 days? No   If yes, was it positive []  OR negative [] ? If positive in the last 5 days, please schedule virtual visit now. If negative, schedule for an in person OV with the next available provider if PCP has no openings. Please also let patient know they will be tested again (follow the script below)  "you will have to arrive 31mins prior to your appt time to be Covid tested. Please park in back of office at the cone & call [phone] to let the staff know you have arrived. A staff member will meet you at your car to do a rapid covid test. Once the test has resulted you will be notified by phone of your results to determine if appt will remain an in person visit or be converted to a virtual/phone visit. If you arrive less than 15mins before your appt time, your visit will be automatically converted to virtual & any recommended testing will happen AFTER the visit."  Pt sch for office with dr banks 12-05-2021 THINGS TO REMEMBER  If no availability for virtual visit in office,  please schedule another [place] office  If no availability at another [place] office, please instruct patient that they can schedule an evisit or virtual visit through their mychart account. Visits up to 8pm  patients can be seen in office 5 days after positive COVID test

## 2021-12-05 ENCOUNTER — Encounter: Payer: Self-pay | Admitting: Family Medicine

## 2021-12-05 ENCOUNTER — Ambulatory Visit: Payer: Managed Care, Other (non HMO) | Admitting: Family Medicine

## 2021-12-05 VITALS — BP 122/78 | HR 76 | Temp 99.3°F | Wt 239.8 lb

## 2021-12-05 DIAGNOSIS — J4540 Moderate persistent asthma, uncomplicated: Secondary | ICD-10-CM | POA: Diagnosis not present

## 2021-12-05 DIAGNOSIS — R5383 Other fatigue: Secondary | ICD-10-CM

## 2021-12-05 DIAGNOSIS — R002 Palpitations: Secondary | ICD-10-CM | POA: Diagnosis not present

## 2021-12-05 LAB — COMPREHENSIVE METABOLIC PANEL
ALT: 17 U/L (ref 0–35)
AST: 14 U/L (ref 0–37)
Albumin: 4.6 g/dL (ref 3.5–5.2)
Alkaline Phosphatase: 76 U/L (ref 39–117)
BUN: 9 mg/dL (ref 6–23)
CO2: 27 mEq/L (ref 19–32)
Calcium: 9.9 mg/dL (ref 8.4–10.5)
Chloride: 103 mEq/L (ref 96–112)
Creatinine, Ser: 0.77 mg/dL (ref 0.40–1.20)
GFR: 87.14 mL/min (ref >60.00)
Glucose, Bld: 92 mg/dL (ref 70–99)
Potassium: 3.7 mEq/L (ref 3.5–5.1)
Sodium: 141 mEq/L (ref 135–145)
Total Bilirubin: 0.4 mg/dL (ref 0.2–1.2)
Total Protein: 7.7 g/dL (ref 6.0–8.3)

## 2021-12-05 LAB — CBC WITH DIFFERENTIAL/PLATELET
Basophils Absolute: 0 10*3/uL (ref 0.0–0.1)
Basophils Relative: 0.4 % (ref 0.0–3.0)
Eosinophils Absolute: 0.3 10*3/uL (ref 0.0–0.7)
Eosinophils Relative: 4.6 % (ref 0.0–5.0)
HCT: 42.9 % (ref 36.0–46.0)
Hemoglobin: 14.4 g/dL (ref 12.0–15.0)
Lymphocytes Relative: 42.4 % (ref 12.0–46.0)
Lymphs Abs: 3.1 10*3/uL (ref 0.7–4.0)
MCHC: 33.5 g/dL (ref 30.0–36.0)
MCV: 80.5 fl (ref 78.0–100.0)
Monocytes Absolute: 0.4 10*3/uL (ref 0.1–1.0)
Monocytes Relative: 5.6 % (ref 3.0–12.0)
Neutro Abs: 3.5 10*3/uL (ref 1.4–7.7)
Neutrophils Relative %: 47 % (ref 43.0–77.0)
Platelets: 442 10*3/uL — ABNORMAL HIGH (ref 150.0–400.0)
RBC: 5.33 Mil/uL — ABNORMAL HIGH (ref 3.87–5.11)
RDW: 14.4 % (ref 11.5–15.5)
WBC: 7.4 10*3/uL (ref 4.0–10.5)

## 2021-12-05 LAB — T4, FREE: Free T4: 0.69 ng/dL (ref 0.60–1.60)

## 2021-12-05 LAB — VITAMIN B12: Vitamin B-12: 382 pg/mL (ref 211–911)

## 2021-12-05 LAB — VITAMIN D 25 HYDROXY (VIT D DEFICIENCY, FRACTURES): VITD: 28.24 ng/mL — ABNORMAL LOW (ref 30.00–100.00)

## 2021-12-05 LAB — TSH: TSH: 1.74 u[IU]/mL (ref 0.35–5.50)

## 2021-12-05 NOTE — Progress Notes (Signed)
Subjective:    Patient ID: Patricia Pineda, female    DOB: [birth year], 56 y.o.   MRN: [id number]  Chief Complaint  Patient presents with   Fatigue    Happens all at once, has happened a few times. When it does happen, its like a sluggish tired. Also happens randomly. Still doing the albuterol and advair for her asthma    HPI Patient was seen today for ongoing concern.  Patient endorses brief episodes of fatigue times months.  Can occur anytime.  Episodes was placed on Monday while sitting at her desk, a rush of fatigue and palpitations occurred.  Patient inquires about iron being low.  States sleep could be better.  Patient is a light sleeper and any little noise will wake her up.  Taking 1 or 2 melatonin nightly.  Could take a nap during the day.  Typically does not snore but did over the holidays.  Patient denies changes in medications.  Seeing pulmonology for moderate persistent asthma.  Taking Advair occasionally.  Notes wheezing at night when laying down.  Past Medical History:  Diagnosis Date   Anemia    Arthritis    knees   Fatty liver 2022   Hiatal hernia    High cholesterol    Hypertension    Pneumonia     Allergies  Allergen Reactions   Shellfish Allergy     ROS General: Denies fever, chills, night sweats, changes in weight, changes in appetite  + fatigue HEENT: Denies headaches, ear pain, changes in vision, rhinorrhea, sore throat CV: Denies CP, SOB, orthopnea + palpitations Pulm: Denies SOB, cough  + wheezing GI: Denies abdominal pain, nausea, vomiting, diarrhea, constipation GU: Denies dysuria, hematuria, frequency, vaginal discharge Msk: Denies muscle cramps, joint pains Neuro: Denies weakness, numbness, tingling Skin: Denies rashes, bruising Psych: Denies depression, anxiety, hallucinations  Objective:    Blood pressure 122/78, pulse 76, temperature 99.3 F (37.4 C), temperature source Oral, weight 239 lb 12.8 oz (108.8 kg), last menstrual period 08/17/2016,  SpO2 98 %.  Gen. Pleasant, well-nourished, in no distress, normal affect   HEENT: [place]/AT, face symmetric, conjunctiva clear, no scleral icterus, PERRLA, EOMI, nares patent without drainage Lungs: no accessory muscle use, CTAB, no wheezes or rales Cardiovascular: RRR, no m/r/g, no peripheral edema Musculoskeletal: No deformities, no cyanosis or clubbing, normal tone Neuro:  A&Ox3, CN II-XII intact, normal gait Skin:  Warm, no lesions/ rash   Wt Readings from Last 3 Encounters:  04/23/21 245 lb 9.6 oz (111.4 kg)  01/29/21 245 lb 3.2 oz (111.2 kg)  08/10/20 247 lb 9.6 oz (112.3 kg)    Lab Results  Component Value Date   WBC 8.9 04/23/2021   HGB 14.3 04/23/2021   HCT 41.6 04/23/2021   PLT 438.0 (H) 04/23/2021   GLUCOSE 90 04/23/2021   CHOL 218 (H) 04/23/2021   TRIG 119.0 04/23/2021   HDL 54.20 04/23/2021   LDLDIRECT 147.0 05/11/2013   LDLCALC 140 (H) 04/23/2021   ALT 21 04/23/2021   AST 15 04/23/2021   NA 140 04/23/2021   K 3.7 04/23/2021   CL 103 04/23/2021   CREATININE 0.71 04/23/2021   BUN 11 04/23/2021   CO2 28 04/23/2021   TSH 1.62 04/23/2021   HGBA1C 5.9 04/23/2021    Assessment/Plan:  Fatigue, unspecified type  -New concern -Discussed possible causes including electrolyte or thyroid dysfunction, also consider cardiac etiology -If labs normal, Holter monitor - Plan: CMP, Vitamin B12, CBC with Differential/Platelet, TSH, T4, Free, Iron, TIBC and  Ferritin Panel, Vitamin D, 25-hydroxy  Palpitations -New concern -Discussed limiting caffeine and chocolate intake, decreasing stress - Plan: CMP, CBC with Differential/Platelet, TSH, T4, Free, Iron, TIBC and Ferritin Panel  Moderate persistent asthma without complication -Stable -Encouraged to take Advair daily and albuterol as needed -Continue follow-up with pulmonology -Consider antihistamine  F/u as needed  Grier Mitts, MD

## 2021-12-06 LAB — IRON,TIBC AND FERRITIN PANEL
%SAT: 20 % (calc) (ref 16–45)
Ferritin: 55 ng/mL (ref 16–232)
Iron: 66 ug/dL (ref 45–160)
TIBC: 335 mcg/dL (calc) (ref 250–450)

## 2021-12-07 ENCOUNTER — Other Ambulatory Visit: Payer: Self-pay | Admitting: Family Medicine

## 2021-12-07 DIAGNOSIS — E559 Vitamin D deficiency, unspecified: Secondary | ICD-10-CM

## 2021-12-07 MED ORDER — VITAMIN D (ERGOCALCIFEROL) 1.25 MG (50000 UNIT) PO CAPS: 50000.0000 [IU] | ORAL_CAPSULE | ORAL | 0 refills | Status: DC

## 2021-12-10 ENCOUNTER — Telehealth: Payer: Self-pay | Admitting: Family Medicine

## 2021-12-10 NOTE — Telephone Encounter (Signed)
Patient saw lab results online but don't understand them all.  She is requesting a call back.

## 2021-12-11 NOTE — Telephone Encounter (Signed)
See result note.  

## 2022-01-09 ENCOUNTER — Other Ambulatory Visit: Payer: Self-pay | Admitting: Family Medicine

## 2022-01-09 DIAGNOSIS — I10 Essential (primary) hypertension: Secondary | ICD-10-CM

## 2022-01-25 ENCOUNTER — Telehealth: Payer: Self-pay | Admitting: Family Medicine

## 2022-01-25 NOTE — Telephone Encounter (Signed)
Spoke with pt, appt scheduled.  

## 2022-01-25 NOTE — Telephone Encounter (Signed)
Pt is calling and would like md to call her to discuss getting new medication. Pt did not want to make an appt nor tell me the name of medication ?

## 2022-01-31 ENCOUNTER — Encounter: Payer: Self-pay | Admitting: Family Medicine

## 2022-01-31 ENCOUNTER — Telehealth (INDEPENDENT_AMBULATORY_CARE_PROVIDER_SITE_OTHER): Payer: Managed Care, Other (non HMO) | Admitting: Family Medicine

## 2022-01-31 DIAGNOSIS — Z6838 Body mass index (BMI) 38.0-38.9, adult: Secondary | ICD-10-CM

## 2022-01-31 DIAGNOSIS — R7303 Prediabetes: Secondary | ICD-10-CM

## 2022-01-31 DIAGNOSIS — Z7689 Persons encountering health services in other specified circumstances: Secondary | ICD-10-CM

## 2022-01-31 DIAGNOSIS — E6609 Other obesity due to excess calories: Secondary | ICD-10-CM

## 2022-01-31 MED ORDER — TIRZEPATIDE 2.5 MG/0.5ML ~~LOC~~ SOAJ: 2.5000 mg | SUBCUTANEOUS | 0 refills | Status: DC

## 2022-01-31 NOTE — Progress Notes (Signed)
Virtual Visit via Video Note ? ?I connected with Patricia Pineda on 01/31/22 at 11:00 AM EDT by a video enabled telemedicine application 2/2 HQION-62 pandemic and verified that I am speaking with the correct person using two identifiers. ? Location patient: home ?Location provider:work or home office ?Persons participating in the virtual visit: patient, provider ? ?I discussed the limitations of evaluation and management by telemedicine and the availability of in person appointments. The patient expressed understanding and agreed to proceed. ? ?Chief Complaint  ?Patient presents with  ? Medication Problem  ?  Want to talk about mounjaro. The info on it, as she was informed by wt loss provider to try it   ? ? ?HPI: ?Pt is a 56 year old female with pmh sig for HTN, moderate persistent asthma, arthritis, anemia, HLD, insomnia, obesity, vitamin D deficiency, h/o hiatal hernia and GERD who was seen today for weight management.  Pt motivated to lose wt as it will help improve her cholesterol, bs, and overall health.  Pt has been seeing a wt management provider via telemedicine in [place]?[facility] Mounjaro 2.5 mg wkly two wks ago.  Lost 3 lbs.  Seeing improvement in energy, sleep, and overall feeling better since being on med.  Appetite is decreased, no longer having cravings for large meals.  May eat half of normal portion.  Increasing water intake.  Drinking less (coca-cola) soda.  Going to the gym 4 days/wk and walking for exercise.  Pt also lifting wts at home. ? ?In the past pt went to the bariatric clinic which was not helpful.  Phentermine caused nausea.  Also tried weight watches ~1 yr ago which helped, but only was only able to lose 10 lbs.  ? ?Patient denies personal or family history of medullary thyroid cancer, pancreatitis, cholelithiasis. ? ?ROS: See pertinent positives and negatives per HPI. ? ?Past Medical History:  ?Diagnosis Date  ? Anemia   ? Arthritis   ? knees  ? Fatty liver 2022  ? Hiatal hernia   ?  High cholesterol   ? Hypertension   ? Pneumonia   ? ? ?Past Surgical History:  ?Procedure Laterality Date  ? ABDOMINAL HYSTERECTOMY  09/17/2016  ? CESAREAN SECTION    ? HYSTERECTOMY ABDOMINAL WITH SALPINGECTOMY Bilateral 09/17/2016  ? Procedure: HYSTERECTOMY ABDOMINAL WITH BILATERAL SALPINGECTOMY, LEFT OOPHORECTOMY;  Surgeon: Marylynn Pearson, MD;  Location: [facility];  Service: Gynecology;  Laterality: Bilateral;  ? ? ?Family History  ?Problem Relation Age of Onset  ? Hypertension Father   ? Prostate cancer Father   ? Heart attack Maternal Grandmother   ?     early 50s  ? Arthritis Paternal Grandmother   ? Asthma Child   ? Colon cancer Neg Hx   ? Esophageal cancer Neg Hx   ? Rectal cancer Neg Hx   ? Stomach cancer Neg Hx   ? ? ?Current Outpatient Medications:  ?  albuterol (VENTOLIN HFA) 108 (90 Base) MCG/ACT inhaler, INHALE TWO PUFFS BY MOUTH EVERY 6 HOURS AS NEEDED FOR WHEEZING OR SHORTNESS OF BREATH, Disp: 8.5 g, Rfl: 0 ?  amLODipine (NORVASC) 10 MG tablet, TAKE ONE TABLET BY MOUTH DAILY, Disp: 90 tablet, Rfl: 0 ?  famotidine (PEPCID) 20 MG tablet, Take 20 mg by mouth at bedtime., Disp: , Rfl:  ?  fluticasone-salmeterol (ADVAIR) 250-50 MCG/ACT AEPB, Inhale 1 puff into the lungs every 12 (twelve) hours., Disp: 60 each, Rfl: 5 ?  meloxicam (MOBIC) 15 MG tablet, Take 15 mg by mouth as needed for  pain., Disp: , Rfl:  ?  simvastatin (ZOCOR) 10 MG tablet, TAKE 1 TABLET BY MOUTH THREE TIMES A WEEK, Disp: 38 tablet, Rfl: 2 ?  Vitamin D, Ergocalciferol, (DRISDOL) 1.25 MG (50000 UNIT) CAPS capsule, Take 1 capsule (50,000 Units total) by mouth every 7 (seven) days., Disp: 12 capsule, Rfl: 0 ?  EPINEPHrine 0.3 mg/0.3 mL IJ SOAJ injection, Inject 0.3 mLs (0.3 mg total) into the muscle as needed for anaphylaxis. (Patient not taking: Reported on 12/05/2021), Disp: 2 each, Rfl: 2 ? ?Current Facility-Administered Medications:  ?  0.9 %  sodium chloride infusion, 500 mL, Intravenous, Once, Armbruster, Carlota Raspberry, MD ? ?EXAM: ? ?VITALS  per patient if applicable:  RR between 12-20 bpm, wt 236 lbs ? ?GENERAL: alert, oriented, appears well and in no acute distress ? ?HEENT: atraumatic, conjunctiva clear, no obvious abnormalities on inspection of external nose and ears ? ?NECK: normal movements of the head and neck ? ?LUNGS: on inspection no signs of respiratory distress, breathing rate appears normal, no obvious gross SOB, gasping or wheezing ? ?CV: no obvious cyanosis ? ?MS: moves all visible extremities without noticeable abnormality ? ?PSYCH/NEURO: pleasant and cooperative, no obvious depression or anxiety, speech and thought processing grossly intact ? ?ASSESSMENT AND PLAN: ? ?Discussed the following assessment and plan: ? ?Encounter for weight management ? ?Class 2 obesity due to excess calories without serious comorbidity with body mass index (BMI) of 38.0 to 38.9 in adult ?-BMI 38.70 kg/m2 on 12/05/21 ?- Plan: tirzepatide (MOUNJARO) 2.5 MG/0.5ML Pen ? ?Prediabetes ?-hgb A1C 5.9% on 04/23/21 ? ?Patient doing well on Mounjaro 2.5 mg weekly as started by weight management provider via telehealth.  Has lost 3 pounds.  Congratulated on weight loss and efforts to exercise consistently.  Patient interested in continuing medication in addition to lifestyle modifications.  Discussed increasing dose to 5 mg weekly, however patient would like to continue 2.5 mg weekly for another month, then reassess.  Labs reviewed from 12/05/2021 which were normal with the exception of vitamin D being low at 28.24. ? ?F/u in 6-8 wks ?  ?I discussed the assessment and treatment plan with the patient. The patient was provided an opportunity to ask questions and all were answered. The patient agreed with the plan and demonstrated an understanding of the instructions. ?  ?The patient was advised to call back or seek an in-person evaluation if the symptoms worsen or if the condition fails to improve as anticipated. ? ?Billie Ruddy, MD  ? ?

## 2022-02-06 ENCOUNTER — Telehealth: Payer: Self-pay | Admitting: Family Medicine

## 2022-02-06 DIAGNOSIS — E6609 Other obesity due to excess calories: Secondary | ICD-10-CM

## 2022-02-06 NOTE — Telephone Encounter (Signed)
Patient called in stating that her insurance declined her PA for tirzepatide [facility]) 2.5 MG/0.5ML Pen [[id number]]  to be sent to [facility]. Patient is now requesting for the medication to be sent to another pharmacy. ? ?[facility] ?Phone: [phone] ?Fax: [phone] ? ?Please advise. ?

## 2022-02-07 NOTE — Telephone Encounter (Signed)
Not sure I understand.  Insurance declined the PA for the med b/c they just aren't paying for it or b/c they prefer a different pharmacy? ?

## 2022-02-08 ENCOUNTER — Other Ambulatory Visit: Payer: Self-pay | Admitting: Family Medicine

## 2022-02-08 DIAGNOSIS — E6609 Other obesity due to excess calories: Secondary | ICD-10-CM

## 2022-02-08 NOTE — Telephone Encounter (Signed)
There are several [facility] listed.  Is there an address for the one you are trying to use. ?

## 2022-02-14 ENCOUNTER — Other Ambulatory Visit: Payer: Self-pay | Admitting: Family Medicine

## 2022-02-14 MED ORDER — TIRZEPATIDE 5 MG/0.5ML ~~LOC~~ SOAJ: 5.0000 mg | SUBCUTANEOUS | 0 refills | Status: DC

## 2022-02-14 NOTE — Telephone Encounter (Signed)
Mounjaro sent to mail order pharmacy. ?

## 2022-03-08 ENCOUNTER — Other Ambulatory Visit: Payer: Self-pay | Admitting: Nurse Practitioner

## 2022-03-08 DIAGNOSIS — N644 Mastodynia: Secondary | ICD-10-CM

## 2022-04-01 ENCOUNTER — Ambulatory Visit
Admission: RE | Admit: 2022-04-01 | Discharge: 2022-04-01 | Disposition: A | Discharge status: Home / Self Care | Payer: Managed Care, Other (non HMO) | Source: Ambulatory Visit | Attending: Nurse Practitioner | Admitting: Nurse Practitioner

## 2022-04-01 DIAGNOSIS — N644 Mastodynia: Secondary | ICD-10-CM

## 2022-04-20 ENCOUNTER — Other Ambulatory Visit: Payer: Self-pay | Admitting: Family Medicine

## 2022-04-20 DIAGNOSIS — I10 Essential (primary) hypertension: Secondary | ICD-10-CM

## 2022-04-24 ENCOUNTER — Ambulatory Visit (INDEPENDENT_AMBULATORY_CARE_PROVIDER_SITE_OTHER): Payer: Managed Care, Other (non HMO) | Admitting: Family Medicine

## 2022-04-24 ENCOUNTER — Encounter: Payer: Self-pay | Admitting: Family Medicine

## 2022-04-24 VITALS — BP 122/84 | HR 74 | Temp 98.8°F | Ht 65.25 in | Wt 231.0 lb

## 2022-04-24 DIAGNOSIS — T50905A Adverse effect of unspecified drugs, medicaments and biological substances, initial encounter: Secondary | ICD-10-CM

## 2022-04-24 DIAGNOSIS — Z0000 Encounter for general adult medical examination without abnormal findings: Secondary | ICD-10-CM

## 2022-04-24 DIAGNOSIS — E782 Mixed hyperlipidemia: Secondary | ICD-10-CM | POA: Diagnosis not present

## 2022-04-24 DIAGNOSIS — Z1159 Encounter for screening for other viral diseases: Secondary | ICD-10-CM

## 2022-04-24 DIAGNOSIS — I700 Atherosclerosis of aorta: Secondary | ICD-10-CM

## 2022-04-24 DIAGNOSIS — J4540 Moderate persistent asthma, uncomplicated: Secondary | ICD-10-CM | POA: Diagnosis not present

## 2022-04-24 DIAGNOSIS — Z6838 Body mass index (BMI) 38.0-38.9, adult: Secondary | ICD-10-CM

## 2022-04-24 DIAGNOSIS — I10 Essential (primary) hypertension: Secondary | ICD-10-CM

## 2022-04-24 DIAGNOSIS — R634 Abnormal weight loss: Secondary | ICD-10-CM

## 2022-04-24 LAB — LIPID PANEL
Cholesterol: 237 mg/dL — ABNORMAL HIGH (ref 0–200)
HDL: 49.6 mg/dL (ref >39.00)
LDL Cholesterol: 162 mg/dL — ABNORMAL HIGH (ref 0–99)
NonHDL: 187.32
Total CHOL/HDL Ratio: 5
Triglycerides: 129 mg/dL (ref 0.0–149.0)
VLDL: 25.8 mg/dL (ref 0.0–40.0)

## 2022-04-24 LAB — COMPREHENSIVE METABOLIC PANEL
ALT: 15 U/L (ref 0–35)
AST: 12 U/L (ref 0–37)
Albumin: 4.4 g/dL (ref 3.5–5.2)
Alkaline Phosphatase: 75 U/L (ref 39–117)
BUN: 18 mg/dL (ref 6–23)
CO2: 28 mEq/L (ref 19–32)
Calcium: 10 mg/dL (ref 8.4–10.5)
Chloride: 103 mEq/L (ref 96–112)
Creatinine, Ser: 0.95 mg/dL (ref 0.40–1.20)
GFR: 67.54 mL/min (ref >60.00)
Glucose, Bld: 80 mg/dL (ref 70–99)
Potassium: 3.7 mEq/L (ref 3.5–5.1)
Sodium: 140 mEq/L (ref 135–145)
Total Bilirubin: 0.4 mg/dL (ref 0.2–1.2)
Total Protein: 7.3 g/dL (ref 6.0–8.3)

## 2022-04-24 LAB — CBC WITH DIFFERENTIAL/PLATELET
Basophils Absolute: 0 10*3/uL (ref 0.0–0.1)
Basophils Relative: 0.4 % (ref 0.0–3.0)
Eosinophils Absolute: 0.3 10*3/uL (ref 0.0–0.7)
Eosinophils Relative: 4.5 % (ref 0.0–5.0)
HCT: 42.4 % (ref 36.0–46.0)
Hemoglobin: 14.5 g/dL (ref 12.0–15.0)
Lymphocytes Relative: 43.3 % (ref 12.0–46.0)
Lymphs Abs: 3.3 10*3/uL (ref 0.7–4.0)
MCHC: 34.3 g/dL (ref 30.0–36.0)
MCV: 81.1 fl (ref 78.0–100.0)
Monocytes Absolute: 0.5 10*3/uL (ref 0.1–1.0)
Monocytes Relative: 6.3 % (ref 3.0–12.0)
Neutro Abs: 3.4 10*3/uL (ref 1.4–7.7)
Neutrophils Relative %: 45.5 % (ref 43.0–77.0)
Platelets: 458 10*3/uL — ABNORMAL HIGH (ref 150.0–400.0)
RBC: 5.23 Mil/uL — ABNORMAL HIGH (ref 3.87–5.11)
RDW: 14.6 % (ref 11.5–15.5)
WBC: 7.5 10*3/uL (ref 4.0–10.5)

## 2022-04-24 LAB — HEMOGLOBIN A1C: Hgb A1c MFr Bld: 5.8 % (ref 4.6–6.5)

## 2022-04-24 LAB — T4, FREE: Free T4: 0.78 ng/dL (ref 0.60–1.60)

## 2022-04-24 LAB — TSH: TSH: 1.44 u[IU]/mL (ref 0.35–5.50)

## 2022-04-24 MED ORDER — AMLODIPINE BESYLATE 10 MG PO TABS: 10.0000 mg | ORAL_TABLET | Freq: Every day | ORAL | 3 refills | Status: DC

## 2022-04-24 MED ORDER — ALBUTEROL SULFATE HFA 108 (90 BASE) MCG/ACT IN AERS: INHALATION_SPRAY | RESPIRATORY_TRACT | 5 refills | Status: DC

## 2022-04-24 MED ORDER — SIMVASTATIN 10 MG PO TABS: ORAL_TABLET | ORAL | 2 refills | Status: DC

## 2022-04-24 MED ORDER — FLUTICASONE-SALMETEROL 250-50 MCG/ACT IN AEPB: 1.0000 | INHALATION_SPRAY | Freq: Two times a day (BID) | RESPIRATORY_TRACT | 5 refills | Status: DC

## 2022-04-24 NOTE — Progress Notes (Signed)
Subjective:     Patricia Pineda is a 56 y.o. female and is here for a comprehensive physical exam. The patient reports doing well.  Patient recently increased her Mounjaro from 2.5 to 5 mg weekly for weight management.  Patient followed by online weight management provider.  Patient exercising and doing light weights to help tone arms.  Also working on diet changes.  Patient has follow-up with OB/GYN for Pap next month.  Mammogram done 04/01/2022.  Colonoscopy done 12/24/2018.  Patient considering getting another COVID booster.  Patient endorses having COVID x2.  Social History   Socioeconomic History   Marital status: Married    Spouse name: Not on file   Number of children: Not on file   Years of education: Not on file   Highest education level: Not on file  Occupational History   Occupation: mental health supervisor  Tobacco Use   Smoking status: Never   Smokeless tobacco: Never  Vaping Use   Vaping Use: Never used  Substance and Sexual Activity   Alcohol use: Yes    Alcohol/week: 1.0 standard drink    Types: 1 Glasses of wine per week    Comment: occassionally   Drug use: No   Sexual activity: Yes    Partners: Female    Birth control/protection: Surgical  Other Topics Concern   Not on file  Social History Narrative   55 year old daughter and 63 year old son   Social Determinants of Radio broadcast assistant Strain: Not on file  Food Insecurity: Not on file  Transportation Needs: Not on file  Physical Activity: Not on file  Stress: Not on file  Social Connections: Not on file  Intimate Partner Violence: Not on file   Health Maintenance  Topic Date Due   HIV Screening  Never done   Hepatitis C Screening  Never done   Zoster Vaccines- Shingrix (1 of 2) Never done   COVID-19 Vaccine (4 - Booster for Pfizer series) 12/31/2020   PAP SMEAR-Modifier  06/16/2021   TETANUS/TDAP  03/18/2022   INFLUENZA VACCINE  06/18/2022   COLONOSCOPY (Pts 45-65yr Insurance coverage will  need to be confirmed)  12/25/2023   MAMMOGRAM  04/01/2024   HPV VACCINES  Aged Out    The following portions of the patient's history were reviewed and updated as appropriate: allergies, current medications, past family history, past medical history, past social history, past surgical history, and problem list.  Review of Systems Pertinent items noted in HPI and remainder of comprehensive ROS otherwise negative.   Objective:    BP 122/84 (BP Location: Left Arm, Patient Position: Sitting, Cuff Size: Large)   Pulse 74   Temp 98.8 F (37.1 C) (Oral)   Ht 5' 5.25" (1.657 m)   Wt 231 lb (104.8 kg)   LMP 08/17/2016 (Exact Date)   SpO2 97%   BMI 38.15 kg/m  General appearance: alert and cooperative Head: Normocephalic, without obvious abnormality, atraumatic Eyes: conjunctivae/corneas clear. PERRL, EOM's intact. Fundi benign. Ears: normal TM's and external ear canals both ears Nose: Nares normal. Septum midline. Mucosa normal. No drainage or sinus tenderness. Throat: lips, mucosa, and tongue normal; teeth and gums normal Neck: no adenopathy, no carotid bruit, no JVD, supple, symmetrical, trachea midline, and thyroid not enlarged, symmetric, no tenderness/mass/nodules Lungs: clear to auscultation bilaterally Heart: regular rate and rhythm, S1, S2 normal, no murmur, click, rub or gallop Abdomen: soft, non-tender; bowel sounds normal; no masses,  no organomegaly Extremities: extremities normal, atraumatic,  no cyanosis or edema Pulses: 2+ and symmetric Skin: Skin color, texture, turgor normal. No rashes or lesions Lymph nodes: Cervical, supraclavicular, and axillary nodes normal. Neurologic: Alert and oriented X 3, normal strength and tone. Normal symmetric reflexes. Normal coordination and gait    Assessment:    Healthy female exam.      Plan:    Anticipatory guidance given including wearing seatbelts, smoke detectors in the home, increasing physical activity, increasing p.o. intake  of water and vegetables. -obtain labs -mammogram up to date, done 04/01/22 -Colonoscopy up-to-date done 12/24/2018.  Recall 10 years -Pap scheduled next month with OB/GYN -Reviewed immunizations.  Patient plans to obtain COVID booster at local pharmacy.  Also discussed tetanus booster. -Given handout -Next CPE in 1 year See After Visit Summary for Counseling Recommendations   Weight loss due to medication -Body mass index is 38.15 kg/m. -9 lbs wt loss since Jan 2023.   -Continue Mounjaro 5 mg weekly.  Recently increased dose. -Continue follow-up with weight management virtual provider. -Continue lifestyle modifications  - Plan: TSH, T4, Free, Hemoglobin A1c, CMP  Encounter for hepatitis C screening test for low risk patient  - Plan: Hep C Antibody  Class 2 severe obesity due to excess calories with serious comorbidity and body mass index (BMI) of 38.0 to 38.9 in adult (HCC)  -Body mass index is 38.15 kg/m. -Continue lifestyle modifications. -Patient congratulated on 9 pound weight loss since starting Mounjaro weekly - Plan: CBC with Differential/Platelet, Hemoglobin A1c, Lipid panel, CMP  Moderate persistent asthma without complication -Stable -Continue follow-up with pulmonology.  - Plan: albuterol (VENTOLIN HFA) 108 (90 Base) MCG/ACT inhaler, fluticasone-salmeterol (ADVAIR) 250-50 MCG/ACT AEPB  Mixed hyperlipidemia  -Total cholesterol 218, LDL 140, triglycerides 119, HDL 54 on 04/23/2021 -Continue lifestyle modifications -Continue Zocor 10 mg 3 times weekly - Plan: Hemoglobin A1c, Lipid panel, simvastatin (ZOCOR) 10 MG tablet, CMP  Aortic atherosclerosis (HCC) -Given handout - Plan: simvastatin (ZOCOR) 10 MG tablet  Essential hypertension  -Controlled -Continue lifestyle modifications -Continue Norvasc 10 mg daily - Plan: amLODipine (NORVASC) 10 MG tablet  F/u in 3-4 months prn  Grier Mitts, MD

## 2022-04-25 LAB — HEPATITIS C ANTIBODY
Hepatitis C Ab: NONREACTIVE
SIGNAL TO CUT-OFF: 0.13 (ref <1.00)

## 2022-05-28 ENCOUNTER — Other Ambulatory Visit: Payer: Self-pay

## 2022-05-28 MED ORDER — TIRZEPATIDE 5 MG/0.5ML ~~LOC~~ SOAJ: 5.0000 mg | SUBCUTANEOUS | 0 refills | Status: DC

## 2022-05-29 ENCOUNTER — Telehealth: Payer: Self-pay | Admitting: Family Medicine

## 2022-05-29 NOTE — Telephone Encounter (Signed)
Spoke to patient. She inform that she has been using her coupon for mounjaro in the past, now it is expired. She want to inform for [place] to proceed on the appeal. Given number [phone] to call. Patient states she contacted the her insurance and they told her the Darcel Bayley is covered but need a PA. Also that they would walk [place] through the appeal. Will contact the given number for an appeal.

## 2022-05-29 NOTE — Telephone Encounter (Signed)
Pt is calling and has some info and questions. Pt did not want to elaborate with me the reasons and request cma to return her call

## 2022-06-05 NOTE — Telephone Encounter (Signed)
Spoke to [employer] records, labs, patient insurance was faxed to [phone] for an appeal of Mounjaro.

## 2022-06-06 ENCOUNTER — Telehealth: Payer: Self-pay | Admitting: Family Medicine

## 2022-06-06 NOTE — Telephone Encounter (Signed)
Pt called, following up on prior authorization for [facility]...  Please call her back with any new updates [phone]  Thank you.

## 2022-06-07 ENCOUNTER — Telehealth: Payer: Self-pay

## 2022-06-07 NOTE — Telephone Encounter (Signed)
Key: [id number] - PA Case ID: [id number] Sent to Plan today Drug Mounjaro '5MG'$ /0.5ML pen-injectors Form [employer] Electronic Prior Authorization Form (2017 NCPDP)

## 2022-06-12 NOTE — Telephone Encounter (Signed)
PA denied. Sent in an appeal on 7/25

## 2022-06-13 NOTE — Telephone Encounter (Signed)
Dr Leonides Sake with PA appeal dept is calling and would like dr banks or staff  to return his call

## 2022-06-17 NOTE — Telephone Encounter (Signed)
Spoke to Dr. Ballard Russell. He states [employer] requires a diagnose of diabetes type 2. Without it, it denied.

## 2022-06-24 ENCOUNTER — Encounter: Payer: Self-pay | Admitting: Family Medicine

## 2022-06-24 ENCOUNTER — Ambulatory Visit: Payer: Managed Care, Other (non HMO) | Admitting: Family Medicine

## 2022-06-24 VITALS — BP 122/80 | HR 80 | Temp 98.9°F | Wt 230.0 lb

## 2022-06-24 DIAGNOSIS — R58 Hemorrhage, not elsewhere classified: Secondary | ICD-10-CM

## 2022-06-24 DIAGNOSIS — R5383 Other fatigue: Secondary | ICD-10-CM

## 2022-06-24 DIAGNOSIS — R634 Abnormal weight loss: Secondary | ICD-10-CM

## 2022-06-24 DIAGNOSIS — N951 Menopausal and female climacteric states: Secondary | ICD-10-CM

## 2022-06-24 DIAGNOSIS — T50905A Adverse effect of unspecified drugs, medicaments and biological substances, initial encounter: Secondary | ICD-10-CM | POA: Diagnosis not present

## 2022-06-24 LAB — CBC WITH DIFFERENTIAL/PLATELET
Basophils Absolute: 0 10*3/uL (ref 0.0–0.1)
Basophils Relative: 0.4 % (ref 0.0–3.0)
Eosinophils Absolute: 0.3 10*3/uL (ref 0.0–0.7)
Eosinophils Relative: 4 % (ref 0.0–5.0)
HCT: 40 % (ref 36.0–46.0)
Hemoglobin: 13.7 g/dL (ref 12.0–15.0)
Lymphocytes Relative: 47.9 % — ABNORMAL HIGH (ref 12.0–46.0)
Lymphs Abs: 3.8 10*3/uL (ref 0.7–4.0)
MCHC: 34.3 g/dL (ref 30.0–36.0)
MCV: 81.5 fl (ref 78.0–100.0)
Monocytes Absolute: 0.5 10*3/uL (ref 0.1–1.0)
Monocytes Relative: 6.6 % (ref 3.0–12.0)
Neutro Abs: 3.2 10*3/uL (ref 1.4–7.7)
Neutrophils Relative %: 41.1 % — ABNORMAL LOW (ref 43.0–77.0)
Platelets: 414 10*3/uL — ABNORMAL HIGH (ref 150.0–400.0)
RBC: 4.91 Mil/uL (ref 3.87–5.11)
RDW: 14.4 % (ref 11.5–15.5)
WBC: 7.9 10*3/uL (ref 4.0–10.5)

## 2022-06-24 LAB — COMPREHENSIVE METABOLIC PANEL
ALT: 16 U/L (ref 0–35)
AST: 11 U/L (ref 0–37)
Albumin: 4.6 g/dL (ref 3.5–5.2)
Alkaline Phosphatase: 62 U/L (ref 39–117)
BUN: 13 mg/dL (ref 6–23)
CO2: 27 mEq/L (ref 19–32)
Calcium: 9.8 mg/dL (ref 8.4–10.5)
Chloride: 106 mEq/L (ref 96–112)
Creatinine, Ser: 0.86 mg/dL (ref 0.40–1.20)
GFR: 76.02 mL/min (ref >60.00)
Glucose, Bld: 87 mg/dL (ref 70–99)
Potassium: 4 mEq/L (ref 3.5–5.1)
Sodium: 143 mEq/L (ref 135–145)
Total Bilirubin: 0.4 mg/dL (ref 0.2–1.2)
Total Protein: 7.5 g/dL (ref 6.0–8.3)

## 2022-06-24 LAB — TSH: TSH: 1 u[IU]/mL (ref 0.35–5.50)

## 2022-06-24 LAB — LUTEINIZING HORMONE: LH: 36.55 m[IU]/mL

## 2022-06-24 LAB — FOLLICLE STIMULATING HORMONE: FSH: 62.4 m[IU]/mL

## 2022-06-24 LAB — T4, FREE: Free T4: 0.81 ng/dL (ref 0.60–1.60)

## 2022-06-24 LAB — VITAMIN D 25 HYDROXY (VIT D DEFICIENCY, FRACTURES): VITD: 26.06 ng/mL — ABNORMAL LOW (ref 30.00–100.00)

## 2022-06-24 LAB — HEMOGLOBIN A1C: Hgb A1c MFr Bld: 5.8 % (ref 4.6–6.5)

## 2022-06-24 NOTE — Progress Notes (Signed)
Subjective:    Patient ID: Patricia Pineda, female    DOB: [birth year], 56 y.o.   MRN: [id number]  Chief Complaint  Patient presents with   Follow-up    Wt loss med options  Bruise on inner right arm     HPI Patient was seen today for follow-up on weight management.  Patient last seen 04/24/2022 for CPE.  At time pt was taking Mounjaro 5 mg weekly for weight via an online weight management provider.  Pt used coupon, but price increased to $400 after coupon expired.  Patient's insurance denied several PAs for the medication.  Pt had HAs and dizziness x 1 wk after stopping med.  Pt now seeing a local bariatric clinic, taking phentermine.  Endorses fatigue.  Taking vitamin B12 and vitamin D.  Pt afraid to regain the 8 lbs lost while on Mounjaro.  Pt notes decreased appetite, only eating 1 meal per day.  Walking for exercise and drinking more water.  Pt concerned about menopause due to hot flashes, mood swings.  Black coash was not helpful.  Pt s/p hysterectomy 2017.  Followed by OB/Gyn.  Endorses healing bruise on R medial upper arm.  Denies injury.   Past Medical History:  Diagnosis Date   Anemia    Arthritis    knees   Fatty liver 2022   Hiatal hernia    High cholesterol    Hypertension    Pneumonia     Allergies  Allergen Reactions   Shellfish Allergy     ROS General: Denies fever, chills, night sweats+ changes in weight, changes in appetite, hot flashes,  HEENT: Denies headaches, ear pain, changes in vision, rhinorrhea, sore throat CV: Denies CP, palpitations, SOB, orthopnea Pulm: Denies SOB, cough, wheezing GI: Denies abdominal pain, nausea, vomiting, diarrhea, constipation GU: Denies dysuria, hematuria, frequency, vaginal discharge Msk: Denies muscle cramps, joint pains Neuro: Denies weakness, numbness, tingling Skin: Denies rashes, bruising  +ecchymosis Psych: Denies depression, anxiety, hallucinations     Objective:    Blood pressure 122/80, pulse 80, temperature 98.9  F (37.2 C), temperature source Oral, weight 230 lb (104.3 kg), last menstrual period 08/17/2016, SpO2 98 %.Body mass index is 37.98 kg/m.  Gen. Pleasant, well-nourished, in no distress, normal affect   HEENT: [place]/AT, face symmetric, conjunctiva clear, no scleral icterus, PERRLA, EOMI, nares patent without drainage Lungs: no accessory muscle use, CTAB, no wheezes or rales Cardiovascular: RRR, no m/r/g, no peripheral edema Neuro:  A&Ox3, CN II-XII intact, normal gait Skin:  Warm, no lesions/ rash.  Area of faint ecchymosis/discoloration on R medial upper arm   Wt Readings from Last 3 Encounters:  04/24/22 231 lb (104.8 kg)  12/05/21 239 lb 12.8 oz (108.8 kg)  04/23/21 245 lb 9.6 oz (111.4 kg)    Lab Results  Component Value Date   WBC 7.5 04/24/2022   HGB 14.5 04/24/2022   HCT 42.4 04/24/2022   PLT 458.0 (H) 04/24/2022   GLUCOSE 80 04/24/2022   CHOL 237 (H) 04/24/2022   TRIG 129.0 04/24/2022   HDL 49.60 04/24/2022   LDLDIRECT 147.0 05/11/2013   LDLCALC 162 (H) 04/24/2022   ALT 15 04/24/2022   AST 12 04/24/2022   NA 140 04/24/2022   K 3.7 04/24/2022   CL 103 04/24/2022   CREATININE 0.95 04/24/2022   BUN 18 04/24/2022   CO2 28 04/24/2022   TSH 1.44 04/24/2022   HGBA1C 5.8 04/24/2022    Assessment/Plan:  Weight loss due to medication -Body mass index is 37.98  kg/m. -Intentional -Previously on Mounjaro, but not FDA approved for wt loss. And pt's insurance does not cover weight loss medications. -Continue phentermine as rx'd by bariatric clinic -advised to eat several small meals and avoid eating once/day. -change exercise routine -consider counseling regarding relationship with food. -Continue follow-up with bariatric clinic  Ecchymosis  -resolving -possibly 2/2 medication - Plan: TSH, T4, Free, CBC with Differential/Platelet, CMP  Perimenopause  -s/p hysterectomy 2017 -given handout -discussed various medication options to treat symptoms including gabapentin,  SSRIs, HRT. -advised to f/u with OB/Gyn - Plan: Follicle Stimulating Hormone, Luteinizing Hormone  Fatigue, unspecified type  - Plan: TSH, T4, Free, Hemoglobin A1c, Vitamin D, 25-hydroxy  F/u prn  Grier Mitts, MD

## 2022-06-25 ENCOUNTER — Other Ambulatory Visit: Payer: Self-pay | Admitting: Family Medicine

## 2022-06-25 DIAGNOSIS — E559 Vitamin D deficiency, unspecified: Secondary | ICD-10-CM

## 2022-06-25 MED ORDER — VITAMIN D (ERGOCALCIFEROL) 1.25 MG (50000 UNIT) PO CAPS: 50000.0000 [IU] | ORAL_CAPSULE | ORAL | 0 refills | Status: DC

## 2022-07-23 ENCOUNTER — Telehealth: Payer: Self-pay | Admitting: Family Medicine

## 2022-07-23 NOTE — Telephone Encounter (Signed)
Pt saw her last labs and sees that she is entering menopause and is seeking guidance on whether she should seek treatment from her ob gyn or whether you have some suggestions on how she can deal with menopausal symptoms

## 2022-07-24 NOTE — Telephone Encounter (Signed)
Spoke with pt, informed her to ask her GYN, if they have any recommendations, per PCP.

## 2022-08-21 IMAGING — MG DIGITAL DIAGNOSTIC BILAT W/ TOMO W/ CAD
6 of 10 series · 6 of 30 positions shown · non-contrast
Comparison: Previous exam(s).

CLINICAL DATA: Patient reports a tender lump in the lateral right
breast.

EXAM:
DIGITAL DIAGNOSTIC BILATERAL MAMMOGRAM WITH TOMOSYNTHESIS AND CAD;
ULTRASOUND RIGHT BREAST LIMITED
TECHNIQUE: Bilateral digital diagnostic mammography and breast tomosynthesis
was performed. The images were evaluated with computer-aided
detection.; Targeted ultrasound examination of the right breast was
performed

[R CC synth-2D]
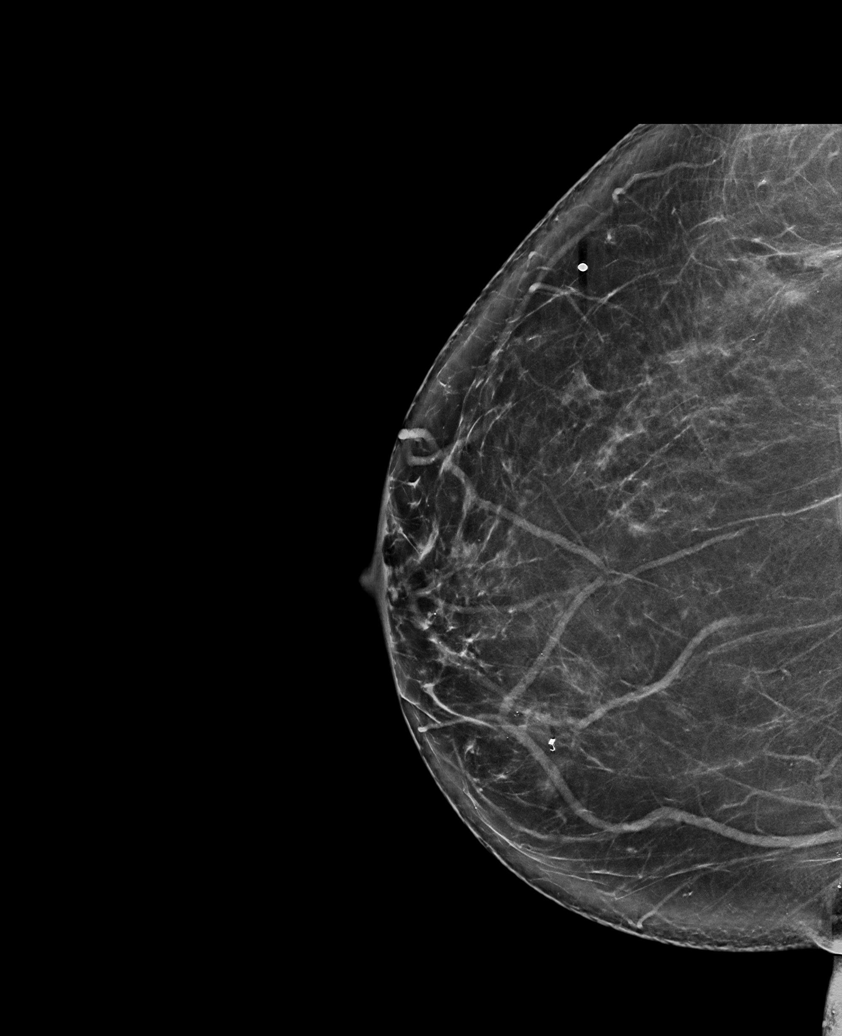

[R TAN synth-2D]
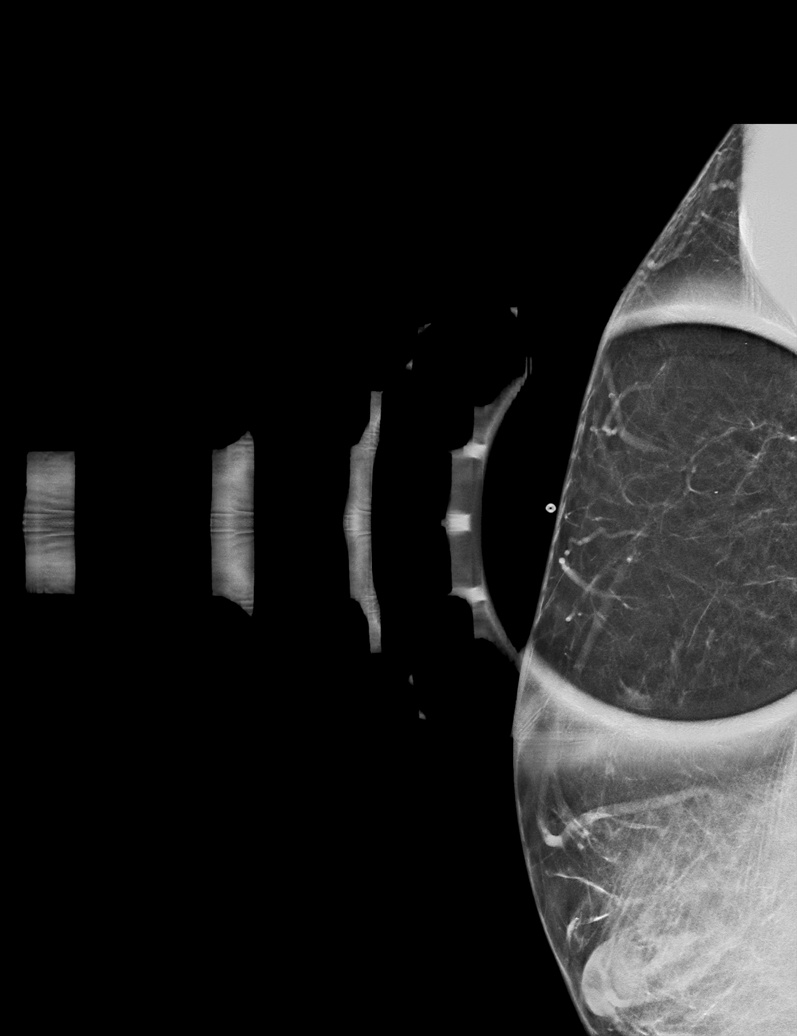

[L CC synth-2D]
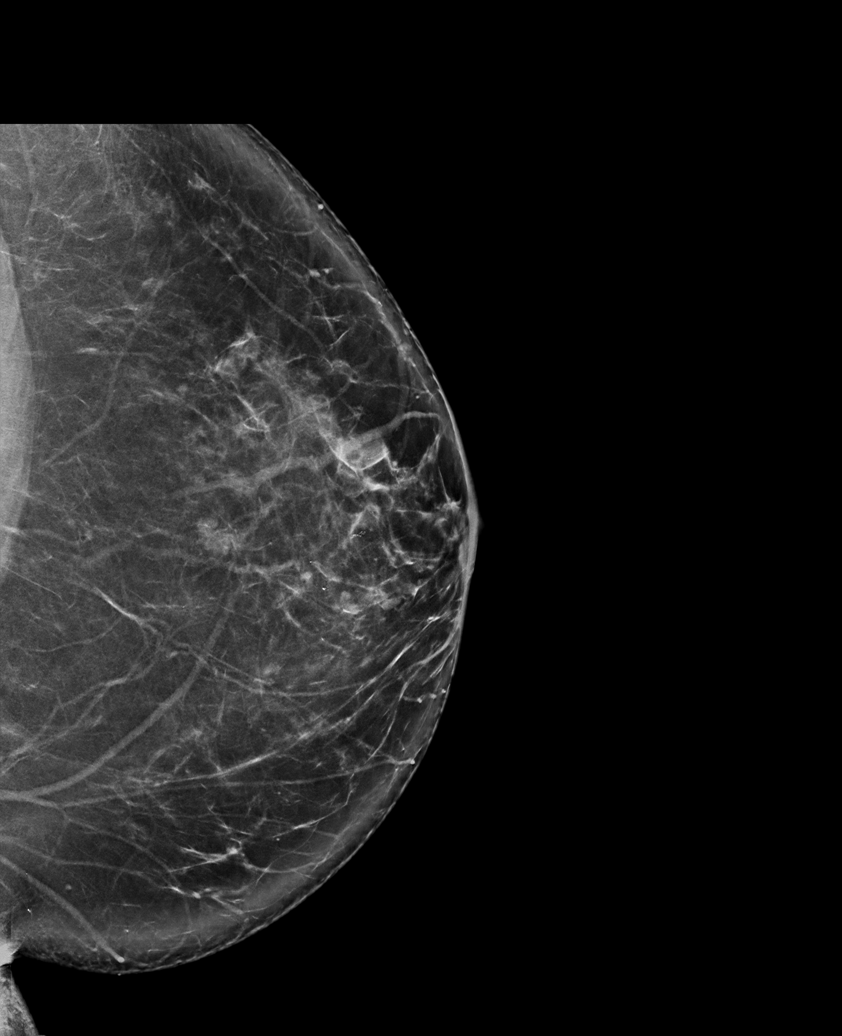

[R MLO synth-2D]
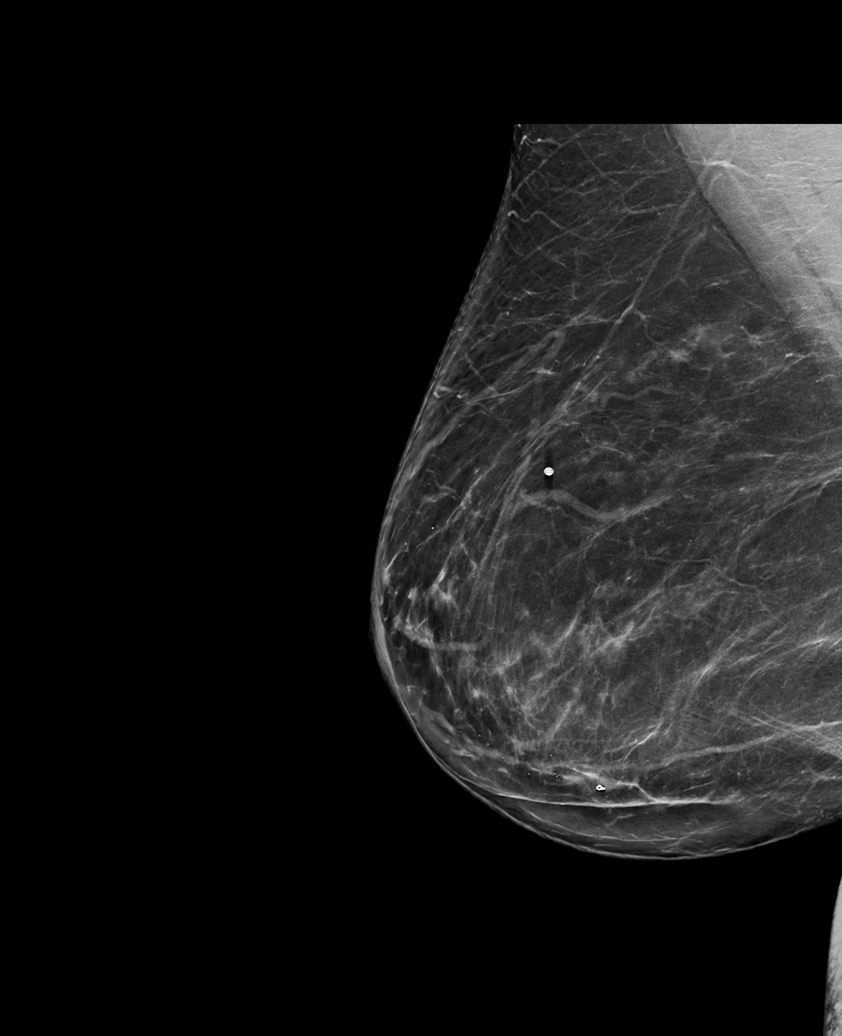

[L MLO synth-2D]
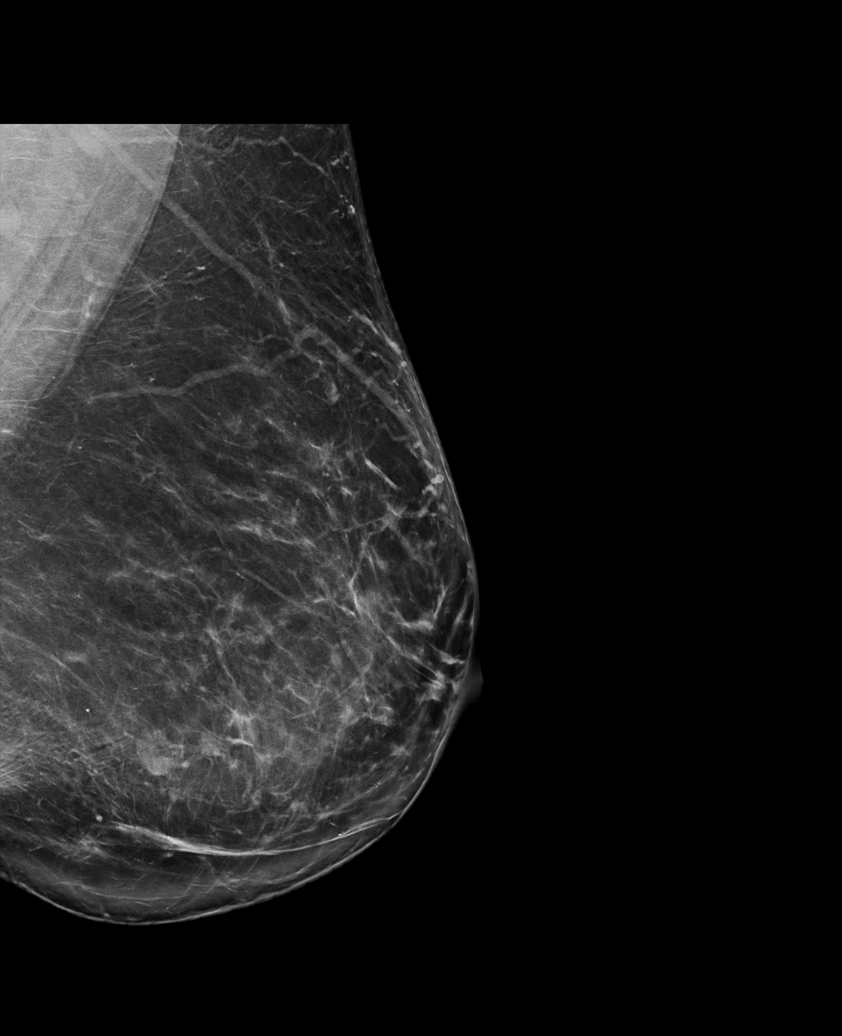

[R TAN tomo · tomo slice 24/47.0]
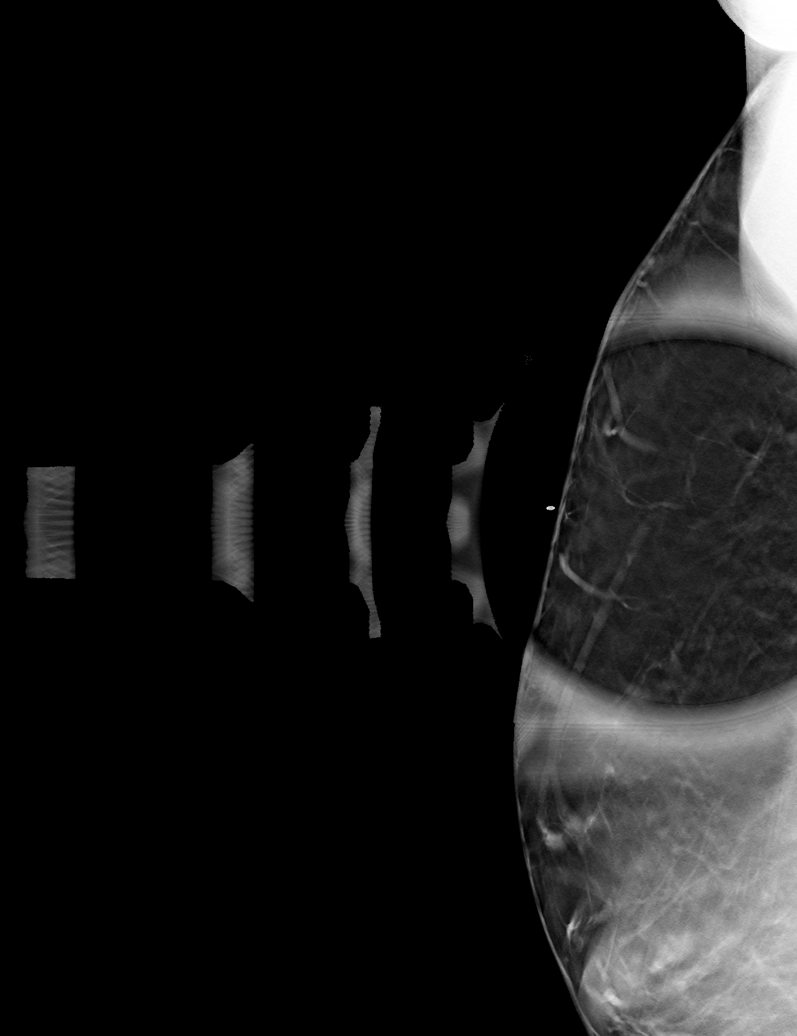

[6 of 30 positions shown; findings below may reference images not displayed]

ACR Breast Density Category b: There are scattered areas of
fibroglandular density.
FINDINGS: There are no suspicious masses, areas of architectural distortion,
areas of significant asymmetry or suspicious calcifications. Coil
shaped biopsy clip lies in the medial right breast from the benign
biopsy performed on 04/12/2021.

On physical exam, no mass is palpated in the lower outer right
breast.

Targeted ultrasound is performed, showing normal tissue in the right
breast, sent at 8 o'clock, 7 cm from the nipple. No mass or
suspicious lesion.
IMPRESSION: No evidence of breast malignancy.

RECOMMENDATION:
Screening mammogram in one year.(Code:7Z-G-R1B)

I have discussed the findings and recommendations with the patient.
If applicable, a reminder letter will be sent to the patient
regarding the next appointment.

BI-RADS CATEGORY  1: Negative.

## 2022-11-05 ENCOUNTER — Telehealth: Payer: Self-pay | Admitting: Family Medicine

## 2022-11-05 NOTE — Telephone Encounter (Signed)
Pt called to say her insurance will no longer cover  fluticasone-salmeterol (ADVAIR) 250-50 MCG/ACT AEPB therefore, she will be needing an alternative.    Pt states she has one refill left and will pick it up this week, but will need the alternative in the new year.   Pt has [employer].  [facility] [id number] Phone: [phone]  Fax: [phone]

## 2022-11-08 NOTE — Telephone Encounter (Signed)
Does pt know what her insurance will cover?

## 2022-11-12 NOTE — Telephone Encounter (Signed)
Spoke to pt. She reports she doesn't know what her insurance can cover and that she is fine with any alternative.

## 2022-11-20 ENCOUNTER — Encounter: Payer: Self-pay | Admitting: Family Medicine

## 2022-11-20 ENCOUNTER — Ambulatory Visit: Payer: Managed Care, Other (non HMO) | Admitting: Family Medicine

## 2022-11-20 VITALS — BP 126/80 | HR 85 | Temp 99.0°F | Wt 222.0 lb

## 2022-11-20 DIAGNOSIS — N3001 Acute cystitis with hematuria: Secondary | ICD-10-CM | POA: Diagnosis not present

## 2022-11-20 DIAGNOSIS — R300 Dysuria: Secondary | ICD-10-CM | POA: Diagnosis not present

## 2022-11-20 LAB — POCT URINALYSIS DIPSTICK
Bilirubin, UA: POSITIVE
Blood, UA: POSITIVE
Glucose, UA: NEGATIVE
Ketones, UA: NEGATIVE
Nitrite, UA: POSITIVE
Protein, UA: POSITIVE — AB
Spec Grav, UA: 1.02 (ref 1.010–1.025)
Urobilinogen, UA: 1 E.U./dL
pH, UA: 6 (ref 5.0–8.0)

## 2022-11-20 MED ORDER — SULFAMETHOXAZOLE-TRIMETHOPRIM 800-160 MG PO TABS: 1.0000 | ORAL_TABLET | Freq: Two times a day (BID) | ORAL | 0 refills | Status: DC

## 2022-11-20 MED ORDER — PHENAZOPYRIDINE HCL 100 MG PO TABS: 100.0000 mg | ORAL_TABLET | Freq: Three times a day (TID) | ORAL | 0 refills | Status: AC | PRN

## 2022-11-20 NOTE — Progress Notes (Signed)
   Acute Office Visit  Subjective:     Patient ID: Patricia Pineda, female    DOB: March 16, 1966, 57 y.o.   MRN: [id number]  Chief Complaint  Patient presents with   Dysuria    Pt reports she noticed the sx on Thursday. Along with feeling urgency to use to the bathroom and using it frequently.     HPI Patient is 57 year old female w/ pmh sig HTN, HLD, obesity, vitamin D deficiency in today for acute concern.  Patient with discomfort at the end of urination last Thursday, 6 days ago.  Tried OTC Azo without improvement in symptoms.  Developed body aches, increased fatigue, slight headache, and nausea 3 days ago.  Try to increase p.o. intake of water.  Denies fever, chills, vomiting, back pain.  ROS + Dysuria, body aches, fatigue, headache, nausea     Objective:    BP 126/80 (BP Location: Right Arm, Patient Position: Sitting, Cuff Size: Large)   Pulse 85   Temp 99 F (37.2 C) (Oral)   Wt 222 lb (100.7 kg)   LMP 08/17/2016 (Exact Date)   SpO2 98%   BMI 36.66 kg/m    Physical Exam Gen. Pleasant, well developed, well-nourished, in NAD HEENT - [place]/AT, PERRL, EOMI, conjunctive clear, no scleral icterus, no nasal drainage Lungs: no use of accessory muscles Cardiovascular: RRR, no peripheral edema Abdomen: BS present, soft, nontender,nondistended, no hepatosplenomegaly.  No CVA tenderness. Neuro:  A&Ox3, CN II-XII intact, normal gait  Results for orders placed or performed in visit on 11/20/22  POC Urinalysis Dipstick  Result Value Ref Range   Color, UA Orange    Clarity, UA Cloudy    Glucose, UA Negative Negative   Bilirubin, UA Positive    Ketones, UA Negative    Spec Grav, UA 1.020 1.010 - 1.025   Blood, UA Positive    pH, UA 6.0 5.0 - 8.0   Protein, UA Positive (A) Negative   Urobilinogen, UA 1.0 0.2 or 1.0 E.U./dL   Nitrite, UA positive    Leukocytes, UA Large (3+) (A) Negative   Appearance     Odor          Assessment & Plan:   Problem List Items Addressed This  Visit   None Visit Diagnoses     Dysuria    -  Primary   Relevant Medications   sulfamethoxazole-trimethoprim (BACTRIM DS) 800-160 MG tablet   phenazopyridine (PYRIDIUM) 100 MG tablet   Other Relevant Orders   POC Urinalysis Dipstick (Completed)   Culture, Urine   Acute cystitis with hematuria       Relevant Medications   sulfamethoxazole-trimethoprim (BACTRIM DS) 800-160 MG tablet   phenazopyridine (PYRIDIUM) 100 MG tablet       Meds ordered this encounter  Medications   sulfamethoxazole-trimethoprim (BACTRIM DS) 800-160 MG tablet    Sig: Take 1 tablet by mouth 2 (two) times daily.    Dispense:  14 tablet    Refill:  0   phenazopyridine (PYRIDIUM) 100 MG tablet    Sig: Take 1 tablet (100 mg total) by mouth 3 (three) times daily as needed for pain.    Dispense:  6 tablet    Refill:  0  Start Bactrim for suspected UTI.  Pyridium as needed.  Increase p.o. intake of water and fluids. Obtain urine culture.  Further recommendations as needed based on culture results.  Return if symptoms worsen or fail to improve.  Billie Ruddy, MD

## 2022-11-20 NOTE — Telephone Encounter (Signed)
Please have patient contact insurance and ask them what they will cover as a replacement for Advair.  This will help ensure pt can get a replacement medication in a timely manner.

## 2022-11-21 NOTE — Telephone Encounter (Signed)
Contacted patient and inform her of message below. Verbalized understanding. Advise pt to call us back with an update.

## 2022-11-23 LAB — URINE CULTURE
MICRO NUMBER:: 14382863
SPECIMEN QUALITY:: ADEQUATE

## 2023-01-03 ENCOUNTER — Telehealth: Payer: Self-pay | Admitting: Family Medicine

## 2023-01-03 NOTE — Telephone Encounter (Signed)
Pt is calling and would like the nurse to return her call concerning a conversation she had with dr banks about menopause. Pt gyn is out of network and per pt dr banks told her she could help her

## 2023-01-08 NOTE — Telephone Encounter (Signed)
Please advise 

## 2023-01-10 NOTE — Telephone Encounter (Signed)
Called pt no answer. Lm for pt to return call.

## 2023-01-10 NOTE — Telephone Encounter (Signed)
Spoke to pt and she stated that she spoke with Dr. Volanda Napoleon about starting a Rx for hot flashes. Pt stated that she is waiting to see if insurance will cover her at the GYN office she is requesting. Pt wanting the medication while she waits on approval.

## 2023-01-10 NOTE — Telephone Encounter (Signed)
Pt is call back for the second time and want a call back

## 2023-01-10 NOTE — Telephone Encounter (Signed)
Please ask patient to provide more detail regarding current issues/concerns in order to best help her.

## 2023-01-31 NOTE — Telephone Encounter (Signed)
Pt calling to today stating she does not seek medication she would like advice on how to handle the hot flashes. Advised to schedule OV

## 2023-02-03 NOTE — Telephone Encounter (Signed)
Appt scheduled on 02/05/2023 with Dr. Volanda Napoleon.

## 2023-02-05 ENCOUNTER — Ambulatory Visit: Payer: Managed Care, Other (non HMO) | Admitting: Family Medicine

## 2023-03-06 ENCOUNTER — Encounter: Payer: Self-pay | Admitting: Family Medicine

## 2023-03-06 ENCOUNTER — Ambulatory Visit: Payer: Managed Care, Other (non HMO) | Admitting: Family Medicine

## 2023-03-06 VITALS — BP 144/86 | HR 80 | Temp 99.2°F | Ht 65.25 in | Wt 226.6 lb

## 2023-03-06 DIAGNOSIS — R351 Nocturia: Secondary | ICD-10-CM

## 2023-03-06 DIAGNOSIS — R1032 Left lower quadrant pain: Secondary | ICD-10-CM

## 2023-03-06 DIAGNOSIS — K5909 Other constipation: Secondary | ICD-10-CM

## 2023-03-06 LAB — POCT URINALYSIS DIPSTICK
Bilirubin, UA: NEGATIVE
Blood, UA: POSITIVE
Glucose, UA: NEGATIVE
Ketones, UA: NEGATIVE
Nitrite, UA: NEGATIVE
Protein, UA: POSITIVE — AB
Spec Grav, UA: 1.02 (ref 1.010–1.025)
Urobilinogen, UA: 0.2 E.U./dL
pH, UA: 6 (ref 5.0–8.0)

## 2023-03-06 MED ORDER — SENNOSIDES-DOCUSATE SODIUM 8.6-50 MG PO TABS
1.0000 | ORAL_TABLET | Freq: Every day | ORAL | 1 refills | Status: DC
Start: 2023-03-06 — End: 2024-02-23

## 2023-03-06 NOTE — Progress Notes (Signed)
Established Patient Office Visit   Subjective  Patient ID: Patricia Pineda, female    DOB: 04/27/1966  Age: 57 y.o. MRN: [id number]  Chief Complaint  Patient presents with   Abdominal Pain    Patient complains of lower abdominal pain,     Patient is a 57 year old female seen today for follow-up on ongoing concerns.  Patient endorses intermittent left lower abdominal/groin pain.  Described as a knotting type sensation.  Can occur at night.  Patient also notes nocturia and occasional increased odor.  Going at least 6 times per night.  Patient has not noticed a change in frequency during the day.  Denies caffeine intake.  May have half a soda on Sundays.  Does not eat spicy foods or other items that can irritate bladder.  Notes chronic constipation.  Has tried MiraLAX, senna, increasing water intake without improvement in symptoms.  Denies having to strain.  History of hysterectomy and oophorectomy 2/2 fibroids.  Has 1 ovary in place.  Denies pelvic pressure, bulge in vagina.  Upcoming appointment with new gynecologist as her previous provider stopped taking her insurance.    Patient Active Problem List   Diagnosis Date Noted   Moderate persistent asthma without complication 04/23/2021   Insomnia 04/23/2021   Hot flashes 04/23/2021   Vitamin D deficiency 04/23/2021   Obesity 12/20/2014   Pure hypercholesterolemia 05/25/2012   Hypertension 05/25/2012   Past Surgical History:  Procedure Laterality Date   ABDOMINAL HYSTERECTOMY  09/17/2016   CESAREAN SECTION     HYSTERECTOMY ABDOMINAL WITH SALPINGECTOMY Bilateral 09/17/2016   Procedure: HYSTERECTOMY ABDOMINAL WITH BILATERAL SALPINGECTOMY, LEFT OOPHORECTOMY;  Surgeon: Zelphia Cairo, MD;  Location: [facility];  Service: Gynecology;  Laterality: Bilateral;   Social History   Tobacco Use   Smoking status: Never   Smokeless tobacco: Never  Vaping Use   Vaping Use: Never used  Substance Use Topics   Alcohol use: Yes    Alcohol/week: 1.0  standard drink of alcohol    Types: 1 Glasses of wine per week    Comment: occassionally   Drug use: No   Family History  Problem Relation Age of Onset   Hypertension Father    Prostate cancer Father    Heart attack Maternal Grandmother        early 17s   Arthritis Paternal Grandmother    Asthma Child    Colon cancer Neg Hx    Esophageal cancer Neg Hx    Rectal cancer Neg Hx    Stomach cancer Neg Hx    Allergies  Allergen Reactions   Shellfish Allergy       ROS Negative unless stated above    Objective:     BP (!) 144/86 (BP Location: Left Arm, Patient Position: Sitting, Cuff Size: Large)   Pulse 80   Temp 99.2 F (37.3 C) (Oral)   Ht 5' 5.25" (1.657 m)   Wt 226 lb 9.6 oz (102.8 kg)   LMP 08/17/2016 (Exact Date)   SpO2 98%   BMI 37.42 kg/m  BP Readings from Last 3 Encounters:  03/06/23 (!) 144/86  11/20/22 126/80  06/24/22 122/80   Wt Readings from Last 3 Encounters:  03/06/23 226 lb 9.6 oz (102.8 kg)  11/20/22 222 lb (100.7 kg)  06/24/22 230 lb (104.3 kg)      Physical Exam Constitutional:      General: She is not in acute distress.    Appearance: Normal appearance.  HENT:     Head: Normocephalic and  atraumatic.     Nose: Nose normal.     Mouth/Throat:     Mouth: Mucous membranes are moist.  Cardiovascular:     Rate and Rhythm: Normal rate and regular rhythm.     Heart sounds: Normal heart sounds. No murmur heard.    No gallop.  Pulmonary:     Effort: Pulmonary effort is normal. No respiratory distress.     Breath sounds: Normal breath sounds. No wheezing, rhonchi or rales.  Abdominal:     General: Bowel sounds are normal.     Tenderness: There is no abdominal tenderness.  Skin:    General: Skin is warm and dry.  Neurological:     Mental Status: She is alert and oriented to person, place, and time.      Results for orders placed or performed in visit on 03/06/23  POCT urinalysis dipstick  Result Value Ref Range   Color, UA Yellow     Clarity, UA Clear    Glucose, UA Negative Negative   Bilirubin, UA Negative    Ketones, UA Negative    Spec Grav, UA 1.020 1.010 - 1.025   Blood, UA Positive    pH, UA 6.0 5.0 - 8.0   Protein, UA Positive (A) Negative   Urobilinogen, UA 0.2 0.2 or 1.0 E.U./dL   Nitrite, UA Negative    Leukocytes, UA Large (3+) (A) Negative   Appearance     Odor        Assessment & Plan:  Nocturia -     POCT urinalysis dipstick -     Urine Culture; Future  Chronic constipation -     Sennosides-Docusate Sodium; Take 1 tablet by mouth daily.  Dispense: 30 tablet; Refill: 1  Left inguinal pain  Discussed possible causes of nocturia including medications, dietary reasons, infection, bladder prolapse, constipation.  UA with protein, RBCs, and 3+ leuks.  Obtain UA.  Daily bowel regimen.     On day of service, 36 minutes spent caring for this patient face-to-face, reviewing the chart, counseling and/or coordinating care for plan and treatment of diagnosis below.    Return if symptoms worsen or fail to improve.   Deeann Saint, MD

## 2023-03-07 NOTE — Addendum Note (Signed)
Addended by: Donald Pore A on: 03/07/2023 07:41 AM   Modules accepted: Orders

## 2023-03-09 LAB — URINE CULTURE
MICRO NUMBER:: 14849231
SPECIMEN QUALITY:: ADEQUATE

## 2023-03-10 ENCOUNTER — Other Ambulatory Visit: Payer: Self-pay | Admitting: Family Medicine

## 2023-03-10 DIAGNOSIS — N3000 Acute cystitis without hematuria: Secondary | ICD-10-CM

## 2023-03-10 MED ORDER — NITROFURANTOIN MONOHYD MACRO 100 MG PO CAPS
100.0000 mg | ORAL_CAPSULE | Freq: Two times a day (BID) | ORAL | 0 refills | Status: AC
Start: 2023-03-10 — End: 2023-03-17

## 2023-05-20 ENCOUNTER — Other Ambulatory Visit: Payer: Self-pay | Admitting: Family Medicine

## 2023-05-20 DIAGNOSIS — I10 Essential (primary) hypertension: Secondary | ICD-10-CM

## 2023-07-28 ENCOUNTER — Other Ambulatory Visit: Payer: Self-pay | Admitting: Family Medicine

## 2023-07-28 DIAGNOSIS — E782 Mixed hyperlipidemia: Secondary | ICD-10-CM

## 2023-07-28 DIAGNOSIS — I700 Atherosclerosis of aorta: Secondary | ICD-10-CM

## 2023-10-11 ENCOUNTER — Other Ambulatory Visit: Payer: Self-pay | Admitting: Family Medicine

## 2023-10-11 DIAGNOSIS — I10 Essential (primary) hypertension: Secondary | ICD-10-CM

## 2023-11-30 ENCOUNTER — Other Ambulatory Visit: Payer: Self-pay | Admitting: Family Medicine

## 2023-11-30 DIAGNOSIS — E782 Mixed hyperlipidemia: Secondary | ICD-10-CM

## 2023-11-30 DIAGNOSIS — I700 Atherosclerosis of aorta: Secondary | ICD-10-CM

## 2024-01-01 ENCOUNTER — Encounter: Payer: Self-pay | Admitting: Family Medicine

## 2024-01-01 ENCOUNTER — Ambulatory Visit: Payer: Managed Care, Other (non HMO) | Admitting: Family Medicine

## 2024-01-01 VITALS — BP 126/80 | HR 79 | Temp 98.7°F | Ht 65.25 in | Wt 222.8 lb

## 2024-01-01 DIAGNOSIS — I10 Essential (primary) hypertension: Secondary | ICD-10-CM | POA: Diagnosis not present

## 2024-01-01 DIAGNOSIS — Z0000 Encounter for general adult medical examination without abnormal findings: Secondary | ICD-10-CM | POA: Diagnosis not present

## 2024-01-01 DIAGNOSIS — E559 Vitamin D deficiency, unspecified: Secondary | ICD-10-CM | POA: Diagnosis not present

## 2024-01-01 DIAGNOSIS — E782 Mixed hyperlipidemia: Secondary | ICD-10-CM

## 2024-01-01 DIAGNOSIS — Z1211 Encounter for screening for malignant neoplasm of colon: Secondary | ICD-10-CM

## 2024-01-01 LAB — COMPREHENSIVE METABOLIC PANEL
ALT: 16 U/L (ref 0–35)
AST: 17 U/L (ref 0–37)
Albumin: 4.5 g/dL (ref 3.5–5.2)
Alkaline Phosphatase: 73 U/L (ref 39–117)
BUN: 14 mg/dL (ref 6–23)
CO2: 29 meq/L (ref 19–32)
Calcium: 9.6 mg/dL (ref 8.4–10.5)
Chloride: 104 meq/L (ref 96–112)
Creatinine, Ser: 0.82 mg/dL (ref 0.40–1.20)
GFR: 79.64 mL/min (ref >60.00)
Glucose, Bld: 93 mg/dL (ref 70–99)
Potassium: 3.7 meq/L (ref 3.5–5.1)
Sodium: 141 meq/L (ref 135–145)
Total Bilirubin: 0.4 mg/dL (ref 0.2–1.2)
Total Protein: 7.5 g/dL (ref 6.0–8.3)

## 2024-01-01 LAB — LIPID PANEL
Cholesterol: 217 mg/dL — ABNORMAL HIGH (ref 0–200)
HDL: 58.5 mg/dL (ref >39.00)
LDL Cholesterol: 131 mg/dL — ABNORMAL HIGH (ref 0–99)
NonHDL: 158.72
Total CHOL/HDL Ratio: 4
Triglycerides: 140 mg/dL (ref 0.0–149.0)
VLDL: 28 mg/dL (ref 0.0–40.0)

## 2024-01-01 LAB — CBC WITH DIFFERENTIAL/PLATELET
Basophils Absolute: 0 10*3/uL (ref 0.0–0.1)
Basophils Relative: 0.4 % (ref 0.0–3.0)
Eosinophils Absolute: 0.2 10*3/uL (ref 0.0–0.7)
Eosinophils Relative: 2.1 % (ref 0.0–5.0)
HCT: 44 % (ref 36.0–46.0)
Hemoglobin: 14.7 g/dL (ref 12.0–15.0)
Lymphocytes Relative: 41.7 % (ref 12.0–46.0)
Lymphs Abs: 3.5 10*3/uL (ref 0.7–4.0)
MCHC: 33.4 g/dL (ref 30.0–36.0)
MCV: 83.5 fL (ref 78.0–100.0)
Monocytes Absolute: 0.5 10*3/uL (ref 0.1–1.0)
Monocytes Relative: 6 % (ref 3.0–12.0)
Neutro Abs: 4.2 10*3/uL (ref 1.4–7.7)
Neutrophils Relative %: 49.8 % (ref 43.0–77.0)
Platelets: 451 10*3/uL — ABNORMAL HIGH (ref 150.0–400.0)
RBC: 5.27 Mil/uL — ABNORMAL HIGH (ref 3.87–5.11)
RDW: 14.3 % (ref 11.5–15.5)
WBC: 8.5 10*3/uL (ref 4.0–10.5)

## 2024-01-01 LAB — TSH: TSH: 2.08 u[IU]/mL (ref 0.35–5.50)

## 2024-01-01 LAB — VITAMIN D 25 HYDROXY (VIT D DEFICIENCY, FRACTURES): VITD: 33.41 ng/mL (ref 30.00–100.00)

## 2024-01-01 LAB — HEMOGLOBIN A1C: Hgb A1c MFr Bld: 5.7 % (ref 4.6–6.5)

## 2024-01-01 LAB — T4, FREE: Free T4: 0.73 ng/dL (ref 0.60–1.60)

## 2024-01-01 NOTE — Progress Notes (Signed)
Established Patient Office Visit   Subjective  Patient ID: Patricia Pineda, female    DOB: Mar 25, 1966  Age: 57 y.o. MRN: [id number]  Chief Complaint  Patient presents with   Annual Exam    Patient is a 58 year old female seen for CPE.  Patient states she was doing well.  Found a new gynecologist in [place].  5 yr recall for colon cancer screening due.  Patient inquires about lowering dose of cholesterol medication and blood pressure medication.  BP typically in the 120s systolic.  Was 140 systolic prior to increasing norvasc.    Patient Active Problem List   Diagnosis Date Noted   Moderate persistent asthma without complication 04/23/2021   Insomnia 04/23/2021   Hot flashes 04/23/2021   Vitamin D deficiency 04/23/2021   Obesity 12/20/2014   Pure hypercholesterolemia 05/25/2012   Hypertension 05/25/2012   Past Medical History:  Diagnosis Date   Anemia    Arthritis    knees   Fatty liver 2022   Hiatal hernia    High cholesterol    Hypertension    Pneumonia    Past Surgical History:  Procedure Laterality Date   ABDOMINAL HYSTERECTOMY  09/17/2016   CESAREAN SECTION     HYSTERECTOMY ABDOMINAL WITH SALPINGECTOMY Bilateral 09/17/2016   Procedure: HYSTERECTOMY ABDOMINAL WITH BILATERAL SALPINGECTOMY, LEFT OOPHORECTOMY;  Surgeon: Zelphia Cairo, MD;  Location: [facility];  Service: Gynecology;  Laterality: Bilateral;   Social History   Tobacco Use   Smoking status: Never   Smokeless tobacco: Never  Vaping Use   Vaping status: Never Used  Substance Use Topics   Alcohol use: Yes    Alcohol/week: 1.0 standard drink of alcohol    Types: 1 Glasses of wine per week    Comment: occassionally   Drug use: No   Family History  Problem Relation Age of Onset   Hypertension Father    Prostate cancer Father    Heart attack Maternal Grandmother        early 103s   Arthritis Paternal Grandmother    Asthma Child    Colon cancer Neg Hx    Esophageal cancer Neg Hx    Rectal  cancer Neg Hx    Stomach cancer Neg Hx    Allergies  Allergen Reactions   Shellfish Allergy       ROS Negative unless stated above    Objective:     BP 126/80 (BP Location: Left Arm, Patient Position: Sitting, Cuff Size: Large)   Pulse 79   Temp 98.7 F (37.1 C) (Oral)   Ht 5' 5.25" (1.657 m)   Wt 222 lb 12.8 oz (101.1 kg)   LMP 08/17/2016 (Exact Date)   SpO2 99%   BMI 36.79 kg/m  BP Readings from Last 3 Encounters:  01/01/24 126/80  03/06/23 (!) 144/86  11/20/22 126/80   Wt Readings from Last 3 Encounters:  01/01/24 222 lb 12.8 oz (101.1 kg)  03/06/23 226 lb 9.6 oz (102.8 kg)  11/20/22 222 lb (100.7 kg)      Physical Exam Constitutional:      Appearance: Normal appearance.  HENT:     Head: Normocephalic and atraumatic.     Right Ear: Tympanic membrane, ear canal and external ear normal.     Left Ear: Tympanic membrane, ear canal and external ear normal.     Nose: Nose normal.     Mouth/Throat:     Mouth: Mucous membranes are moist.     Pharynx: No oropharyngeal exudate or posterior  oropharyngeal erythema.  Eyes:     General: No scleral icterus.    Extraocular Movements: Extraocular movements intact.     Conjunctiva/sclera: Conjunctivae normal.     Pupils: Pupils are equal, round, and reactive to light.  Neck:     Thyroid: No thyromegaly.  Cardiovascular:     Rate and Rhythm: Normal rate and regular rhythm.     Pulses: Normal pulses.     Heart sounds: Normal heart sounds. No murmur heard.    No friction rub.  Pulmonary:     Effort: Pulmonary effort is normal.     Breath sounds: Normal breath sounds. No wheezing, rhonchi or rales.  Abdominal:     General: Bowel sounds are normal.     Palpations: Abdomen is soft.     Tenderness: There is no abdominal tenderness.  Musculoskeletal:        General: No deformity. Normal range of motion.  Lymphadenopathy:     Cervical: No cervical adenopathy.  Skin:    General: Skin is warm and dry.     Findings: No  lesion.  Neurological:     General: No focal deficit present.     Mental Status: She is alert and oriented to person, place, and time.  Psychiatric:        Mood and Affect: Mood normal.        Thought Content: Thought content normal.      01/01/2024    9:55 AM 11/20/2022    9:19 AM 06/24/2022    8:43 AM  Depression screen PHQ 2/9  Decreased Interest 0 0 0  Down, Depressed, Hopeless 0 0 0  PHQ - 2 Score 0 0 0  Altered sleeping 1 1 2   Tired, decreased energy 1 1 1   Change in appetite 1 1 0  Feeling bad or failure about yourself  0 0 0  Trouble concentrating 0 0 1  Moving slowly or fidgety/restless 0 0 0  Suicidal thoughts 0 0 0  PHQ-9 Score 3 3 4   Difficult doing work/chores Not difficult at all Somewhat difficult Not difficult at all      01/01/2024    9:56 AM 04/23/2021    9:42 AM  GAD 7 : Generalized Anxiety Score  Nervous, Anxious, on Edge 0 0  Control/stop worrying 0 0  Worry too much - different things 0 0  Trouble relaxing 0 1  Restless 0 0  Easily annoyed or irritable 0 0  Afraid - awful might happen 0 0  Total GAD 7 Score 0 1  Anxiety Difficulty  Not difficult at all    No results found for any visits on 01/01/24.    Assessment & Plan:  Well adult exam -Anticipatory guidance given including wearing seatbelts, smoke detectors in the home, increasing physical activity, increasing p.o. intake of water and vegetables. -obtain labs -immunizations reviewed.  Pt declines at this time. -Patient to schedule mammogram. -Pap not indicated 2/2 history of hysterectomy. -Colonoscopy done 12/24/2018 with 5 yr recall advised.  Referral placed. -Next CPE in 1 yr. -     CBC with Differential/Platelet; Future -     Comprehensive metabolic panel; Future -     Hemoglobin A1c; Future  Vitamin D deficiency -     VITAMIN D 25 Hydroxy (Vit-D Deficiency, Fractures); Future  Mixed hyperlipidemia -Continue simvastatin 10 mg daily -Continue lifestyle modifications -     Comprehensive  metabolic panel; Future -     Lipid panel; Future  Essential hypertension -Controlled -Continue  Norvasc 10 mg daily -Continue lifestyle modifications -     CBC with Differential/Platelet; Future -     Comprehensive metabolic panel; Future -     TSH; Future -     T4, free; Future  Screen for colon cancer -     Ambulatory referral to Gastroenterology   Return if symptoms worsen or fail to improve.   Deeann Saint, MD

## 2024-01-01 NOTE — Patient Instructions (Signed)
Happy birthday Unice!!!!!

## 2024-01-05 ENCOUNTER — Encounter: Payer: Self-pay | Admitting: Family Medicine

## 2024-01-07 ENCOUNTER — Telehealth: Payer: Self-pay | Admitting: *Deleted

## 2024-01-07 NOTE — Telephone Encounter (Signed)
Copied from CRM [id number]. Topic: General - Other >> Jan 07, 2024 10:41 AM Turkey A wrote: Reason for CRM: Patient called to request if Doctor Salomon Fick or her nurse could call her back regarding stopping by the office to do a UA

## 2024-01-12 NOTE — Telephone Encounter (Signed)
 Unfortunately pt would need to be seen.

## 2024-01-13 NOTE — Telephone Encounter (Signed)
 Spoke with patient she is aware, she has decided to speak with OBGYN.

## 2024-02-13 ENCOUNTER — Other Ambulatory Visit: Payer: Self-pay | Admitting: Family Medicine

## 2024-02-13 DIAGNOSIS — I700 Atherosclerosis of aorta: Secondary | ICD-10-CM

## 2024-02-13 DIAGNOSIS — E782 Mixed hyperlipidemia: Secondary | ICD-10-CM

## 2024-02-19 ENCOUNTER — Other Ambulatory Visit: Payer: Self-pay | Admitting: Family Medicine

## 2024-02-19 DIAGNOSIS — K5909 Other constipation: Secondary | ICD-10-CM

## 2024-03-02 ENCOUNTER — Other Ambulatory Visit: Payer: Self-pay | Admitting: Family Medicine

## 2024-03-02 DIAGNOSIS — I10 Essential (primary) hypertension: Secondary | ICD-10-CM

## 2024-05-26 ENCOUNTER — Other Ambulatory Visit: Payer: Self-pay | Admitting: Family Medicine

## 2024-05-26 DIAGNOSIS — J4540 Moderate persistent asthma, uncomplicated: Secondary | ICD-10-CM

## 2024-06-04 ENCOUNTER — Ambulatory Visit: Admitting: Family Medicine

## 2024-06-04 ENCOUNTER — Encounter: Payer: Self-pay | Admitting: Family Medicine

## 2024-06-04 VITALS — BP 132/78 | HR 73 | Temp 97.7°F | Ht 65.25 in | Wt 222.0 lb

## 2024-06-04 DIAGNOSIS — R0989 Other specified symptoms and signs involving the circulatory and respiratory systems: Secondary | ICD-10-CM

## 2024-06-04 DIAGNOSIS — J4540 Moderate persistent asthma, uncomplicated: Secondary | ICD-10-CM

## 2024-06-04 DIAGNOSIS — R051 Acute cough: Secondary | ICD-10-CM | POA: Diagnosis not present

## 2024-06-04 DIAGNOSIS — Z1211 Encounter for screening for malignant neoplasm of colon: Secondary | ICD-10-CM

## 2024-06-04 DIAGNOSIS — R062 Wheezing: Secondary | ICD-10-CM

## 2024-06-04 DIAGNOSIS — J40 Bronchitis, not specified as acute or chronic: Secondary | ICD-10-CM

## 2024-06-04 DIAGNOSIS — J302 Other seasonal allergic rhinitis: Secondary | ICD-10-CM

## 2024-06-04 MED ORDER — BENZONATATE 100 MG PO CAPS: 100.0000 mg | ORAL_CAPSULE | Freq: Two times a day (BID) | ORAL | 0 refills | Status: AC | PRN

## 2024-06-04 MED ORDER — MONTELUKAST SODIUM 10 MG PO TABS: 10.0000 mg | ORAL_TABLET | Freq: Every day | ORAL | 3 refills | Status: AC

## 2024-06-04 MED ORDER — PREDNISONE 20 MG PO TABS: 20.0000 mg | ORAL_TABLET | Freq: Every day | ORAL | 0 refills | Status: AC

## 2024-06-04 NOTE — Progress Notes (Signed)
 Established Patient Office Visit   Subjective  Patient ID: Patricia Pineda, female    DOB: 07/31/1966  Age: 58 y.o. MRN: [id number]  Chief Complaint  Patient presents with   Medical Management of Chronic Issues   Acute Visit    Patient came in today for a dry cough and congestion, fatigue, wheezing, headaches and chest tightness, started 2 weeks ago    Pt accompanied by her daughter.  Patient is a 58 year old female seen for acute illness.  Patient endorses coughing and wheezing that started 2 weeks ago after walking outside.  Patient now with chest tightness/congestion, myalgia, headaches.  Cough has continued and is dry.  Tried OTC medications without improvement in symptoms.    Patient Active Problem List   Diagnosis Date Noted   Moderate persistent asthma without complication 04/23/2021   Insomnia 04/23/2021   Hot flashes 04/23/2021   Vitamin D  deficiency 04/23/2021   Obesity 12/20/2014   Pure hypercholesterolemia 05/25/2012   Hypertension 05/25/2012   Past Medical History:  Diagnosis Date   Anemia    Arthritis    knees   Fatty liver 2022   Hiatal hernia    High cholesterol    Hypertension    Pneumonia    Past Surgical History:  Procedure Laterality Date   ABDOMINAL HYSTERECTOMY  09/17/2016   CESAREAN SECTION     HYSTERECTOMY ABDOMINAL WITH SALPINGECTOMY Bilateral 09/17/2016   Procedure: HYSTERECTOMY ABDOMINAL WITH BILATERAL SALPINGECTOMY, LEFT OOPHORECTOMY;  Surgeon: Truman Corona, MD;  Location: [facility];  Service: Gynecology;  Laterality: Bilateral;   Social History   Tobacco Use   Smoking status: Never   Smokeless tobacco: Never  Vaping Use   Vaping status: Never Used  Substance Use Topics   Alcohol use: Yes    Alcohol/week: 1.0 standard drink of alcohol    Types: 1 Glasses of wine per week    Comment: occassionally   Drug use: No   Family History  Problem Relation Age of Onset   Hypertension Father    Prostate cancer Father    Heart attack  Maternal Grandmother        early 24s   Arthritis Paternal Grandmother    Asthma Child    Colon cancer Neg Hx    Esophageal cancer Neg Hx    Rectal cancer Neg Hx    Stomach cancer Neg Hx    Allergies  Allergen Reactions   Shellfish Allergy     ROS Negative unless stated above    Objective:     BP 132/78 (BP Location: Left Arm, Patient Position: Sitting, Cuff Size: Large)   Pulse 73   Temp 97.7 F (36.5 C) (Oral)   Ht 5' 5.25 (1.657 m)   Wt 222 lb (100.7 kg)   LMP 08/17/2016 (Exact Date)   BMI 36.66 kg/m  BP Readings from Last 3 Encounters:  06/04/24 132/78  01/01/24 126/80  03/06/23 (!) 144/86   Wt Readings from Last 3 Encounters:  06/04/24 222 lb (100.7 kg)  01/01/24 222 lb 12.8 oz (101.1 kg)  03/06/23 226 lb 9.6 oz (102.8 kg)      Physical Exam Constitutional:      General: She is not in acute distress.    Appearance: Normal appearance.  HENT:     Head: Normocephalic and atraumatic.     Ears:     Comments: Bilateral TMs full.    Nose: Nose normal.     Mouth/Throat:     Mouth: Mucous membranes are moist.  Cardiovascular:     Rate and Rhythm: Normal rate and regular rhythm.     Heart sounds: Normal heart sounds. No murmur heard.    No gallop.  Pulmonary:     Effort: Pulmonary effort is normal. No respiratory distress.     Breath sounds: Normal breath sounds. Decreased air movement present. No wheezing, rhonchi or rales.  Skin:    General: Skin is warm and dry.  Neurological:     Mental Status: She is alert and oriented to person, place, and time.        06/04/2024    3:40 PM 01/01/2024    9:55 AM 11/20/2022    9:19 AM  Depression screen PHQ 2/9  Decreased Interest 0 0 0  Down, Depressed, Hopeless 0 0 0  PHQ - 2 Score 0 0 0  Altered sleeping 1 1 1   Tired, decreased energy 0 1 1  Change in appetite 0 1 1  Feeling bad or failure about yourself  0 0 0  Trouble concentrating 0 0 0  Moving slowly or fidgety/restless 0 0 0  Suicidal thoughts 0 0  0  PHQ-9 Score 1 3 3   Difficult doing work/chores  Not difficult at all Somewhat difficult      06/04/2024    3:40 PM 01/01/2024    9:56 AM 04/23/2021    9:42 AM  GAD 7 : Generalized Anxiety Score  Nervous, Anxious, on Edge 0 0 0  Control/stop worrying 0 0 0  Worry too much - different things 0 0 0  Trouble relaxing 0 0 1  Restless 0 0 0  Easily annoyed or irritable 0 0 0  Afraid - awful might happen 0 0 0  Total GAD 7 Score 0 0 1  Anxiety Difficulty   Not difficult at all     No results found for any visits on 06/04/24.    Assessment & Plan:   Bronchitis -     predniSONE ; Take 1 tablet (20 mg total) by mouth daily with breakfast for 3 days.  Dispense: 3 tablet; Refill: 0 -     Benzonatate ; Take 1 capsule (100 mg total) by mouth 2 (two) times daily as needed for cough.  Dispense: 20 capsule; Refill: 0  Acute cough -     Benzonatate ; Take 1 capsule (100 mg total) by mouth 2 (two) times daily as needed for cough.  Dispense: 20 capsule; Refill: 0  Screen for colon cancer -     Ambulatory referral to Gastroenterology  Wheezing  Chest congestion  Seasonal allergies -     Montelukast  Sodium; Take 1 tablet (10 mg total) by mouth at bedtime.  Dispense: 90 tablet; Refill: 3  Moderate persistent asthma without complication  Patient with ongoing lower respiratory symptoms likely started by allergies now developed into bronchitis.  Given patient's history of asthma discussed prednisone  burst and Tessalon .  Antibiotics not indicated at this time.  Continue supportive care with OTC cough/cold medications, rest, hydration, etc.  Encouraged to use Advair and albuterol  inhalers.  Singulair  refilled.  For continued or worsening symptoms obtain CXR and reevaluate need for ABX.  Colonoscopy last done 12/24/2018 with 5-year recall.  Referral placed this visit.  Return if symptoms worsen or fail to improve.   Clotilda JONELLE Single, MD

## 2024-06-14 ENCOUNTER — Ambulatory Visit: Payer: Self-pay

## 2024-06-14 NOTE — Telephone Encounter (Signed)
 Called and spoke with patient to get her sch to see Dr. Mercer, patient would like to wait and call back, patient's Co-pay is $60

## 2024-06-14 NOTE — Telephone Encounter (Signed)
 FYI Only or Action Required?: Action required by provider: clinical question for provider.  Patient was last seen in primary care on 06/04/2024 by Mercer Clotilda SAUNDERS, MD.  Called Nurse Triage reporting Cough.  Symptoms began several weeks ago.  Interventions attempted: Prescription medications: benzonatate , montelukast , prednisone  x 3 days.  Symptoms are: unchanged.  Triage Disposition: See PCP When Office is Open (Within 3 Days)  Patient/caregiver understands and will follow disposition?: No, refuses disposition     Copied from CRM #[id number]. Topic: Clinical - Red Word Triage >> Jun 14, 2024 11:32 AM Gennette ORN wrote: Red Word that prompted transfer to Nurse Triage: Patient is calling in because she is still have the coughing going on and having a fever. This has been going on for 4 weeks since her last appointment. She checked tempature it was 98.8 but through the night. She been having night sweats as well. Reason for Disposition  Cough has been present for > 3 weeks  Answer Assessment - Initial Assessment Questions Patient says she's been having a cough ongoing since 2 weeks before appt on 06/04/24. She says the cough is a 6-7/10 in severity, coughing up cloudy white sputum, which clears up some throughout the day. She says the prednisone  x 3 days helped some, but she feels like she may need something else. Advised OV, she declined saying her co-pay is $60. She asks if Dr. Mercer could review this note and make a decision from there. Advised I will send this to her and she should hear back from someone regarding the recommendation either today or tomorrow by EOD. She verbalized understanding.      1. ONSET: When did the cough begin?      2 weeks prior to OV on 06/04/24 2. SEVERITY: How bad is the cough today?      04/24/09 3. SPUTUM: Describe the color of your sputum (e.g., none, dry cough; clear, white, yellow, green)     Cloudy white clearing up as the day goes on 4. HEMOPTYSIS:  Are you coughing up any blood? If Yes, ask: How much? (e.g., flecks, streaks, tablespoons, etc.)     No 5. DIFFICULTY BREATHING: Are you having difficulty breathing? If Yes, ask: How bad is it? (e.g., mild, moderate, severe)      Sometimes, but uses inhaler.  6. FEVER: Do you have a fever? If Yes, ask: What is your temperature, how was it measured, and when did it start?     98.8 this morning, but wake up sweating during the night 7. CARDIAC HISTORY: Do you have any history of heart disease? (e.g., heart attack, congestive heart failure)      No 8. LUNG HISTORY: Do you have any history of lung disease?  (e.g., pulmonary embolus, asthma, emphysema)     Asthma 9.. OTHER SYMPTOMS: Do you have any other symptoms? (e.g., runny nose, wheezing, chest pain)       Chest pain twice over a 4 week span since cough started, not currently chest pain or tightness. Wheezing in the morning (uses inhaler), runny nose  Protocols used: Cough - Acute Productive-A-AH

## 2024-06-15 ENCOUNTER — Encounter: Payer: Self-pay | Admitting: Family Medicine

## 2024-06-17 ENCOUNTER — Ambulatory Visit: Admitting: Family Medicine

## 2024-06-25 ENCOUNTER — Other Ambulatory Visit: Payer: Self-pay | Admitting: Obstetrics

## 2024-06-25 DIAGNOSIS — Z1231 Encounter for screening mammogram for malignant neoplasm of breast: Secondary | ICD-10-CM

## 2024-06-29 ENCOUNTER — Encounter (HOSPITAL_COMMUNITY): Payer: Self-pay | Admitting: Emergency Medicine

## 2024-06-29 ENCOUNTER — Ambulatory Visit (HOSPITAL_COMMUNITY)
Admission: EM | Admit: 2024-06-29 | Discharge: 2024-06-29 | Disposition: A | Discharge status: Home / Self Care | Attending: Emergency Medicine | Admitting: Emergency Medicine

## 2024-06-29 ENCOUNTER — Ambulatory Visit (INDEPENDENT_AMBULATORY_CARE_PROVIDER_SITE_OTHER)

## 2024-06-29 DIAGNOSIS — R051 Acute cough: Secondary | ICD-10-CM | POA: Diagnosis not present

## 2024-06-29 DIAGNOSIS — J4541 Moderate persistent asthma with (acute) exacerbation: Secondary | ICD-10-CM | POA: Diagnosis not present

## 2024-06-29 MED ORDER — IPRATROPIUM-ALBUTEROL 0.5-2.5 (3) MG/3ML IN SOLN
RESPIRATORY_TRACT | Status: AC
  Filled 2024-06-29: qty 3

## 2024-06-29 MED ORDER — IPRATROPIUM-ALBUTEROL 0.5-2.5 (3) MG/3ML IN SOLN
3.0000 mL | Freq: Once | RESPIRATORY_TRACT | Status: AC
  Administered 2024-06-29 (×2): 3 mL via RESPIRATORY_TRACT

## 2024-06-29 MED ORDER — PREDNISONE 20 MG PO TABS: 40.0000 mg | ORAL_TABLET | Freq: Every day | ORAL | 0 refills | Status: AC

## 2024-06-29 NOTE — Discharge Instructions (Signed)
 Start taking 2 tablets of prednisone  once daily for 5 days for asthma exacerbation. Continue to use your albuterol  inhaler every 6-8 hours as needed for shortness of breath and wheezing. Follow-up with your primary care provider for further evaluation and management of your asthma.  As discussed it may be beneficial to get back on the Advair that you were previously on to help manage your asthma more long-term. Return here as needed.  If you develop severe trouble breathing, severe chest pain, or passing out please seek immediate medical treatment in the emergency department.

## 2024-06-29 NOTE — ED Triage Notes (Addendum)
 For about a month had cough.  When had cough for 2 weeks saw her PCP and told had bronchitis waas prescribed prednisone .  Reports her back and chest hurt, wheezing-using inhaler. Reports has white-cloudy mucous now when coughs. Repots starts to feel better esp after using inhaler then starts to feel bad again.

## 2024-06-29 NOTE — ED Provider Notes (Signed)
 [facility]    CSN: [id number] Arrival date & time: 06/29/24  0934      History   Chief Complaint Chief Complaint  Patient presents with   Cough    HPI Patricia Pineda is a 58 y.o. female.   Patient presents with persistent cough for about a month.  Patient states that when she initially had a cough for about 2 weeks she saw her primary care provider and was diagnosed with bronchitis and prescribed a few doses of prednisone .  Patient states since then her cough is continued and she has had increased wheezing despite using her albuterol  inhaler.  Patient states that she has some chest tightness and back pain when she coughs.  Patient states the cough is productive.  Patient does report a history of asthma.  Denies fever, body aches, chills.  Patient denies taking any other medication besides using her inhaler.  Patient states that she last to use her inhaler around 7 AM this morning.  The history is provided by the patient and medical records.  Cough   Past Medical History:  Diagnosis Date   Anemia    Arthritis    knees   Fatty liver 2022   Hiatal hernia    High cholesterol    Hypertension    Pneumonia     Patient Active Problem List   Diagnosis Date Noted   Moderate persistent asthma without complication 04/23/2021   Insomnia 04/23/2021   Hot flashes 04/23/2021   Vitamin D  deficiency 04/23/2021   Obesity 12/20/2014   Pure hypercholesterolemia 05/25/2012   Hypertension 05/25/2012    Past Surgical History:  Procedure Laterality Date   ABDOMINAL HYSTERECTOMY  09/17/2016   CESAREAN SECTION     HYSTERECTOMY ABDOMINAL WITH SALPINGECTOMY Bilateral 09/17/2016   Procedure: HYSTERECTOMY ABDOMINAL WITH BILATERAL SALPINGECTOMY, LEFT OOPHORECTOMY;  Surgeon: Truman Corona, MD;  Location: [facility];  Service: Gynecology;  Laterality: Bilateral;    OB History   No obstetric history on file.      Home Medications    Prior to Admission medications    Medication Sig Start Date End Date Taking? Authorizing Provider  predniSONE  (DELTASONE ) 20 MG tablet Take 2 tablets (40 mg total) by mouth daily for 5 days. 06/29/24 07/04/24 Yes Johnie Rumaldo LABOR, NP  ADVAIR DISKUS 250-50 MCG/ACT AEPB INHALE ONE PUFF BY MOUTH EVERY 12 HOURS 05/26/24   Mercer Clotilda SAUNDERS, MD  albuterol  (VENTOLIN  HFA) 108 (90 Base) MCG/ACT inhaler INHALE TWO PUFFS BY MOUTH EVERY 6 HOURS AS NEEDED FOR SHORTNESS OF BREATH OR WHEEZING 05/26/24   Mercer Clotilda SAUNDERS, MD  amLODipine  (NORVASC ) 10 MG tablet TAKE 1 TABLET BY MOUTH DAILY 03/03/24   Mercer Clotilda SAUNDERS, MD  benzonatate  (TESSALON ) 100 MG capsule Take 1 capsule (100 mg total) by mouth 2 (two) times daily as needed for cough. 06/04/24   Mercer Clotilda SAUNDERS, MD  EPINEPHrine  0.3 mg/0.3 mL IJ SOAJ injection Inject 0.3 mLs (0.3 mg total) into the muscle as needed for anaphylaxis. 07/20/19   Mercer Clotilda SAUNDERS, MD  meloxicam (MOBIC) 15 MG tablet Take 15 mg by mouth as needed for pain.    [provider]  montelukast  (SINGULAIR ) 10 MG tablet Take 1 tablet (10 mg total) by mouth at bedtime. 06/04/24   Mercer Clotilda SAUNDERS, MD  phenazopyridine  (PYRIDIUM ) 100 MG tablet Take 1 tablet (100 mg total) by mouth 3 (three) times daily as needed for pain. 11/20/22   Mercer Clotilda SAUNDERS, MD  SENEXON-S 8.6-50 MG tablet TAKE  1 TABLET BY MOUTH DAILY 02/23/24   Mercer Clotilda SAUNDERS, MD  simvastatin  (ZOCOR ) 10 MG tablet TAKE 1 TABLET BY MOUTH THREE TIMES A WEEK *NEED TO MAKE OFFICE VISIT 2/13 FOR MORE REFILLS* 02/16/24   Mercer Clotilda SAUNDERS, MD    Family History Family History  Problem Relation Age of Onset   Hypertension Father    Prostate cancer Father    Heart attack Maternal Grandmother        early 14s   Arthritis Paternal Grandmother    Asthma Child    Colon cancer Neg Hx    Esophageal cancer Neg Hx    Rectal cancer Neg Hx    Stomach cancer Neg Hx     Social History Social History   Tobacco Use   Smoking status: Never   Smokeless tobacco: Never  Vaping Use    Vaping status: Never Used  Substance Use Topics   Alcohol use: Yes    Alcohol/week: 1.0 standard drink of alcohol    Types: 1 Glasses of wine per week    Comment: occassionally   Drug use: No     Allergies   Shellfish allergy and Amoxicillin   Review of Systems Review of Systems  Respiratory:  Positive for cough.    Per HPI  Physical Exam Triage Vital Signs ED Triage Vitals  Encounter Vitals Group     BP 06/29/24 1041 135/87     Girls Systolic BP Percentile --      Girls Diastolic BP Percentile --      Boys Systolic BP Percentile --      Boys Diastolic BP Percentile --      Pulse Rate 06/29/24 1041 73     Resp 06/29/24 1041 18     Temp 06/29/24 1041 98.3 F (36.8 C)     Temp Source 06/29/24 1041 Oral     SpO2 06/29/24 1041 97 %     Weight --      Height --      Head Circumference --      Peak Flow --      Pain Score 06/29/24 1040 6     Pain Loc --      Pain Education --      Exclude from Growth Chart --    No data found.  Updated Vital Signs BP 135/87 (BP Location: Left Arm)   Pulse 73   Temp 98.3 F (36.8 C) (Oral)   Resp 18   LMP 08/17/2016 (Exact Date)   SpO2 97%   Visual Acuity Right Eye Distance:   Left Eye Distance:   Bilateral Distance:    Right Eye Near:   Left Eye Near:    Bilateral Near:     Physical Exam Vitals and nursing note reviewed.  Constitutional:      General: She is awake. She is not in acute distress.    Appearance: Normal appearance. She is well-developed and well-groomed. She is not ill-appearing.  HENT:     Right Ear: Tympanic membrane, ear canal and external ear normal.     Left Ear: Tympanic membrane, ear canal and external ear normal.     Nose: Nose normal.     Mouth/Throat:     Mouth: Mucous membranes are moist.     Pharynx: Oropharynx is clear.  Cardiovascular:     Rate and Rhythm: Normal rate and regular rhythm.  Pulmonary:     Effort: Pulmonary effort is normal.     Breath sounds: Examination of  the  left-upper field reveals wheezing. Examination of the left-middle field reveals wheezing. Examination of the left-lower field reveals wheezing. Wheezing present.  Skin:    General: Skin is warm and dry.  Neurological:     Mental Status: She is alert.  Psychiatric:        Behavior: Behavior is cooperative.      UC Treatments / Results  Labs (all labs ordered are listed, but only abnormal results are displayed) Labs Reviewed - No data to display  EKG   Radiology DG Chest 2 View Result Date: 06/29/2024 CLINICAL DATA:  cough, wheezing EXAM: CHEST - 2 VIEW COMPARISON:  08/16/2019. FINDINGS: There is mild bilateral hilar peribronchial wall thickening/cuffing. Findings are most commonly seen with bronchitis or reactive airway disease, such as asthma. Bilateral lung fields are clear. No acute consolidation or lung collapse. Bilateral costophrenic angles are clear. Normal cardio-mediastinal silhouette. No acute osseous abnormalities. The soft tissues are within normal limits. IMPRESSION: *There is mild bilateral hilar peribronchial wall thickening/cuffing. Findings are most commonly seen with bronchitis or reactive airway disease, such as asthma. Bilateral lung fields are clear. No acute consolidation or lung collapse. Electronically Signed   By: Ree Molt M.D.   On: 06/29/2024 11:40    Procedures Procedures (including critical care time)  Medications Ordered in UC Medications  ipratropium-albuterol  (DUONEB) 0.5-2.5 (3) MG/3ML nebulizer solution 3 mL (3 mLs Nebulization Given 06/29/24 1137)    Initial Impression / Assessment and Plan / UC Course  I have reviewed the triage vital signs and the nursing notes.  Pertinent labs & imaging results that were available during my care of the patient were reviewed by me and considered in my medical decision making (see chart for details).     Patient is overall well-appearing.  Vitals are stable.  Wheezing auscultated throughout left lung.   Chest x-ray ordered to rule out underlying pneumonia.  Based on interpretation there there is no obvious pneumonia, but it does reveal some evidence of peribronchial thickening consistent with bronchitis or reactive airway disease.  Radiology report confirms this.  Symptoms likely related to ongoing asthma exacerbation.  Prescribed prednisone  burst and discussed when to use albuterol  inhaler.  Recommended following up with primary care provider to discuss further management of asthma.  Discussed follow-up, return, and strict ER precautions. Final Clinical Impressions(s) / UC Diagnoses   Final diagnoses:  Acute cough  Moderate persistent asthma with exacerbation     Discharge Instructions      Start taking 2 tablets of prednisone  once daily for 5 days for asthma exacerbation. Continue to use your albuterol  inhaler every 6-8 hours as needed for shortness of breath and wheezing. Follow-up with your primary care provider for further evaluation and management of your asthma.  As discussed it may be beneficial to get back on the Advair that you were previously on to help manage your asthma more long-term. Return here as needed.  If you develop severe trouble breathing, severe chest pain, or passing out please seek immediate medical treatment in the emergency department.     ED Prescriptions     Medication Sig Dispense Auth. Provider   predniSONE  (DELTASONE ) 20 MG tablet Take 2 tablets (40 mg total) by mouth daily for 5 days. 10 tablet Johnie Flaming A, NP      PDMP not reviewed this encounter.   Johnie Flaming A, NP 06/29/24 1221

## 2024-06-30 ENCOUNTER — Other Ambulatory Visit (HOSPITAL_COMMUNITY): Payer: Self-pay

## 2024-07-01 ENCOUNTER — Telehealth: Payer: Self-pay

## 2024-07-01 ENCOUNTER — Other Ambulatory Visit (HOSPITAL_COMMUNITY): Payer: Self-pay

## 2024-07-01 NOTE — Telephone Encounter (Signed)
 Pharmacy Patient Advocate Encounter   Received notification from Onbase that prior authorization for Advair Diskus 250 is required/requested.   Insurance verification completed.   The patient is insured through [employer] .   Per test claim: The current 30 day co-pay is, $216.45.  No PA needed at this time. This test claim was processed through [facility]- copay amounts may vary at other pharmacies due to pharmacy/plan contracts, or as the patient moves through the different stages of their insurance plan.   This is a discount price, patient plan says product/service not covered.

## 2024-07-16 ENCOUNTER — Ambulatory Visit
Admission: RE | Admit: 2024-07-16 | Discharge: 2024-07-16 | Disposition: A | Discharge status: Home / Self Care | Source: Ambulatory Visit | Attending: Obstetrics | Admitting: Obstetrics

## 2024-07-16 DIAGNOSIS — Z1231 Encounter for screening mammogram for malignant neoplasm of breast: Secondary | ICD-10-CM

## 2024-07-20 NOTE — Telephone Encounter (Unsigned)
 Copied from CRM #[id number]. Topic: Clinical - Medication Prior Auth >> Jul 20, 2024  4:11 PM Delon DASEN wrote: Reason for CRM: waiting for Pa for refill either on name brand or generic

## 2024-07-21 NOTE — Telephone Encounter (Signed)
 Spoke with patient she is aware of cost for medication, she is going call her insurance and see where she needs to get the med cheaper

## 2024-07-22 ENCOUNTER — Telehealth: Payer: Self-pay | Admitting: *Deleted

## 2024-07-22 NOTE — Telephone Encounter (Signed)
 Copied from CRM [id number]. Topic: Clinical - Prescription Issue >> Jul 22, 2024 12:19 PM Larissa S wrote: Reason for CRM: Patient is requesting a prescription for  Advair HFA. She states she can not afford the Advair Diskus medication that was sent in and her insurance provider advised her to request the alternative medication.  Callback # [phone]   [facility] [id number] [place], [place] - [address] [place] [address] Phone: [phone] Fax: [phone] Hours: Not open 24 hours

## 2024-07-27 ENCOUNTER — Other Ambulatory Visit (HOSPITAL_COMMUNITY): Payer: Self-pay

## 2024-07-27 ENCOUNTER — Telehealth: Payer: Self-pay

## 2024-07-27 NOTE — Telephone Encounter (Unsigned)
 Copied from CRM [id number]. Topic: Clinical - Prescription Issue >> Jul 22, 2024 12:19 PM Larissa S wrote: Reason for CRM: Patient is requesting a prescription for  Advair HFA. She states she can not afford the Advair Diskus medication that was sent in and her insurance provider advised her to request the alternative medication.  Callback # [phone]   [facility] [id number] [place], [place] - [address] [place] [address] Phone: [phone] Fax: [phone] Hours: Not open 24 hours >> Jul 27, 2024 11:14 AM Corin V wrote: Patient called back to follow up on this. She was diagnosed with acute bronchitis at the emergency room due to breathing problems. Insurance specifically advised her to request the Advair HFA as this was what would be covered to be affordable. Please fix prescription and send in the Advair HFA prescription today so she can obtain medication to assist with breathing. CB: [phone] Please send to: [facility] [id number] [place], [place] - [address]

## 2024-07-29 ENCOUNTER — Other Ambulatory Visit: Payer: Self-pay | Admitting: Family Medicine

## 2024-07-29 DIAGNOSIS — J4540 Moderate persistent asthma, uncomplicated: Secondary | ICD-10-CM

## 2024-07-29 MED ORDER — FLUTICASONE-SALMETEROL 230-21 MCG/ACT IN AERO: 1.0000 | INHALATION_SPRAY | Freq: Two times a day (BID) | RESPIRATORY_TRACT | 11 refills | Status: AC

## 2024-08-20 ENCOUNTER — Encounter: Payer: Self-pay | Admitting: Family Medicine
